# Patient Record
Sex: Female | Born: 1995
Health system: Southern US, Community
[De-identification: ages and names within clinical notes are randomized; demographics above are authoritative.]

## PROBLEM LIST (undated history)

## (undated) ENCOUNTER — Inpatient Hospital Stay (HOSPITAL_COMMUNITY): Payer: Self-pay

## (undated) DIAGNOSIS — E0590 Thyrotoxicosis, unspecified without thyrotoxic crisis or storm: Secondary | ICD-10-CM

## (undated) DIAGNOSIS — I10 Essential (primary) hypertension: Secondary | ICD-10-CM

## (undated) DIAGNOSIS — K219 Gastro-esophageal reflux disease without esophagitis: Secondary | ICD-10-CM

## (undated) DIAGNOSIS — G8929 Other chronic pain: Secondary | ICD-10-CM

## (undated) DIAGNOSIS — K589 Irritable bowel syndrome without diarrhea: Secondary | ICD-10-CM

## (undated) DIAGNOSIS — R109 Unspecified abdominal pain: Secondary | ICD-10-CM

## (undated) HISTORY — DX: Unspecified abdominal pain: R10.9

## (undated) HISTORY — DX: Other chronic pain: G89.29

## (undated) HISTORY — PX: TOOTH EXTRACTION: SUR596

---
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
## 2015-01-26 ENCOUNTER — Ambulatory Visit: Payer: Self-pay | Admitting: Podiatry

## 2015-01-31 ENCOUNTER — Ambulatory Visit: Payer: Self-pay | Admitting: Podiatry

## 2015-02-15 ENCOUNTER — Other Ambulatory Visit: Payer: Self-pay | Admitting: Nurse Practitioner

## 2015-02-15 DIAGNOSIS — K5909 Other constipation: Secondary | ICD-10-CM

## 2015-02-15 DIAGNOSIS — R109 Unspecified abdominal pain: Principal | ICD-10-CM

## 2015-02-15 DIAGNOSIS — R194 Change in bowel habit: Secondary | ICD-10-CM

## 2015-02-15 DIAGNOSIS — R1013 Epigastric pain: Secondary | ICD-10-CM

## 2015-02-15 DIAGNOSIS — G8929 Other chronic pain: Secondary | ICD-10-CM

## 2015-02-22 ENCOUNTER — Ambulatory Visit
Admission: RE | Admit: 2015-02-22 | Discharge: 2015-02-22 | Disposition: A | Discharge status: Home / Self Care | Payer: BLUE CROSS/BLUE SHIELD | Source: Ambulatory Visit | Attending: Nurse Practitioner | Admitting: Nurse Practitioner

## 2015-02-22 DIAGNOSIS — G8929 Other chronic pain: Secondary | ICD-10-CM | POA: Diagnosis present

## 2015-02-22 DIAGNOSIS — R194 Change in bowel habit: Secondary | ICD-10-CM

## 2015-02-22 DIAGNOSIS — R195 Other fecal abnormalities: Secondary | ICD-10-CM | POA: Insufficient documentation

## 2015-02-22 DIAGNOSIS — K5909 Other constipation: Secondary | ICD-10-CM

## 2015-02-22 DIAGNOSIS — R109 Unspecified abdominal pain: Secondary | ICD-10-CM | POA: Insufficient documentation

## 2015-02-22 DIAGNOSIS — K5900 Constipation, unspecified: Secondary | ICD-10-CM | POA: Diagnosis not present

## 2015-02-22 DIAGNOSIS — R1013 Epigastric pain: Secondary | ICD-10-CM

## 2015-03-01 ENCOUNTER — Other Ambulatory Visit: Payer: Self-pay | Admitting: Nurse Practitioner

## 2015-03-01 DIAGNOSIS — G8929 Other chronic pain: Secondary | ICD-10-CM

## 2015-03-01 DIAGNOSIS — K5909 Other constipation: Secondary | ICD-10-CM

## 2015-03-01 DIAGNOSIS — R109 Unspecified abdominal pain: Secondary | ICD-10-CM

## 2015-03-01 DIAGNOSIS — R1013 Epigastric pain: Secondary | ICD-10-CM

## 2015-03-08 ENCOUNTER — Ambulatory Visit: Payer: BLUE CROSS/BLUE SHIELD

## 2015-03-16 ENCOUNTER — Ambulatory Visit
Admission: RE | Admit: 2015-03-16 | Discharge: 2015-03-16 | Disposition: A | Discharge status: Home / Self Care | Payer: BLUE CROSS/BLUE SHIELD | Source: Ambulatory Visit | Attending: Nurse Practitioner | Admitting: Nurse Practitioner

## 2015-03-16 DIAGNOSIS — K5900 Constipation, unspecified: Secondary | ICD-10-CM | POA: Insufficient documentation

## 2015-03-16 DIAGNOSIS — R109 Unspecified abdominal pain: Secondary | ICD-10-CM

## 2015-03-16 DIAGNOSIS — K5909 Other constipation: Secondary | ICD-10-CM

## 2015-03-16 DIAGNOSIS — G8929 Other chronic pain: Secondary | ICD-10-CM

## 2015-03-16 DIAGNOSIS — R1013 Epigastric pain: Secondary | ICD-10-CM

## 2015-03-16 MED ORDER — IOHEXOL 300 MG/ML  SOLN
85.0000 mL | Freq: Once | INTRAMUSCULAR | Status: AC | PRN
  Administered 2015-03-16: 100 mL via INTRAVENOUS

## 2015-06-15 ENCOUNTER — Emergency Department (HOSPITAL_COMMUNITY): Payer: BLUE CROSS/BLUE SHIELD

## 2015-06-15 ENCOUNTER — Encounter (HOSPITAL_COMMUNITY): Payer: Self-pay | Admitting: Family Medicine

## 2015-06-15 ENCOUNTER — Emergency Department (HOSPITAL_COMMUNITY)
Admission: EM | Admit: 2015-06-15 | Discharge: 2015-06-15 | Disposition: A | Discharge status: Home / Self Care | Payer: BLUE CROSS/BLUE SHIELD | Attending: Emergency Medicine | Admitting: Emergency Medicine

## 2015-06-15 DIAGNOSIS — R109 Unspecified abdominal pain: Secondary | ICD-10-CM

## 2015-06-15 DIAGNOSIS — R1013 Epigastric pain: Secondary | ICD-10-CM | POA: Diagnosis not present

## 2015-06-15 DIAGNOSIS — Z3202 Encounter for pregnancy test, result negative: Secondary | ICD-10-CM | POA: Insufficient documentation

## 2015-06-15 DIAGNOSIS — R112 Nausea with vomiting, unspecified: Secondary | ICD-10-CM | POA: Insufficient documentation

## 2015-06-15 DIAGNOSIS — Z792 Long term (current) use of antibiotics: Secondary | ICD-10-CM | POA: Diagnosis not present

## 2015-06-15 LAB — CBC
HEMATOCRIT: 42.6 % (ref 36.0–46.0)
HEMOGLOBIN: 14.8 g/dL (ref 12.0–15.0)
MCH: 31.2 pg (ref 26.0–34.0)
MCHC: 34.7 g/dL (ref 30.0–36.0)
MCV: 89.9 fL (ref 78.0–100.0)
Platelets: 318 10*3/uL (ref 150–400)
RBC: 4.74 MIL/uL (ref 3.87–5.11)
RDW: 12 % (ref 11.5–15.5)
WBC: 11 10*3/uL — AB (ref 4.0–10.5)

## 2015-06-15 LAB — COMPREHENSIVE METABOLIC PANEL
ALT: 14 U/L (ref 14–54)
AST: 21 U/L (ref 15–41)
Albumin: 3.9 g/dL (ref 3.5–5.0)
Alkaline Phosphatase: 67 U/L (ref 38–126)
Anion gap: 11 (ref 5–15)
BUN: 9 mg/dL (ref 6–20)
CHLORIDE: 104 mmol/L (ref 101–111)
CO2: 24 mmol/L (ref 22–32)
Calcium: 9.9 mg/dL (ref 8.9–10.3)
Creatinine, Ser: 0.8 mg/dL (ref 0.44–1.00)
Glucose, Bld: 99 mg/dL (ref 65–99)
POTASSIUM: 4 mmol/L (ref 3.5–5.1)
SODIUM: 139 mmol/L (ref 135–145)
Total Bilirubin: 0.4 mg/dL (ref 0.3–1.2)
Total Protein: 7.6 g/dL (ref 6.5–8.1)

## 2015-06-15 LAB — URINALYSIS, ROUTINE W REFLEX MICROSCOPIC
Bilirubin Urine: NEGATIVE
Glucose, UA: NEGATIVE mg/dL
Hgb urine dipstick: NEGATIVE
KETONES UR: NEGATIVE mg/dL
LEUKOCYTES UA: NEGATIVE
NITRITE: NEGATIVE
PH: 6 (ref 5.0–8.0)
Protein, ur: NEGATIVE mg/dL
SPECIFIC GRAVITY, URINE: 1.02 (ref 1.005–1.030)
Urobilinogen, UA: 0.2 mg/dL (ref 0.0–1.0)

## 2015-06-15 LAB — LIPASE, BLOOD: LIPASE: 27 U/L (ref 22–51)

## 2015-06-15 LAB — HCG, QUANTITATIVE, PREGNANCY: hCG, Beta Chain, Quant, S: <1 m[IU]/mL (ref <5)

## 2015-06-15 MED ORDER — SODIUM CHLORIDE 0.9 % IV BOLUS (SEPSIS)
1000.0000 mL | Freq: Once | INTRAVENOUS | Status: AC
Start: 2015-06-15 — End: 2015-06-15
  Administered 2015-06-15: 1000 mL via INTRAVENOUS

## 2015-06-15 MED ORDER — ACETAMINOPHEN 500 MG PO TABS: 500.0000 mg | ORAL_TABLET | Freq: Four times a day (QID) | ORAL | Status: DC | PRN

## 2015-06-15 MED ORDER — ACETAMINOPHEN 325 MG PO TABS
650.0000 mg | ORAL_TABLET | Freq: Once | ORAL | Status: AC
Start: 2015-06-15 — End: 2015-06-15
  Administered 2015-06-15: 650 mg via ORAL
  Filled 2015-06-15: qty 2

## 2015-06-15 MED ORDER — ONDANSETRON HCL 4 MG PO TABS
4.0000 mg | ORAL_TABLET | Freq: Once | ORAL | Status: DC
  Filled 2015-06-15: qty 1

## 2015-06-15 NOTE — Discharge Instructions (Signed)

## 2015-06-15 NOTE — ED Notes (Signed)
Discharge instructions/prescription reviewed with patient/parents. Understanding verbalized. Patient declined wheelchair at time of discharge. No acute distress noted at time of discharge.

## 2015-06-15 NOTE — ED Notes (Signed)
Pt here for abd cramping, N,V,D that started this am after eating chik fi la. sts laying down makes it better. sts mid abdomen.

## 2015-06-15 NOTE — ED Provider Notes (Signed)
CSN: [id number]     Arrival date & time 06/15/15  1224 History   First MD Initiated Contact with Patient 06/15/15 1531     Chief Complaint  Patient presents with  . Abdominal Pain  . Emesis     (Consider location/radiation/quality/duration/timing/severity/associated sxs/prior Treatment) HPI  Lynn Henry is a 19 y.o. female with no significant past medical history who presents with sudden onset of constant nonradiating epigastric abdominal pain that began after breakfast this morning. Nothing makes it better or worse. Associated symptoms include diarrhea, tenesmus, vomiting, back pain, and headache. Denies chest pain, shortness of breath, neck stiffness, fevers, or chills. No history of abdominal surgeries. No sick contacts. No recent travel. No recent changes in medications.   History reviewed. No pertinent past medical history. History reviewed. No pertinent past surgical history. History reviewed. No pertinent family history. Social History  Substance Use Topics  . Smoking status: Never Smoker   . Smokeless tobacco: Never Used  . Alcohol Use: None   OB History    No data available     Review of Systems All other systems negative unless otherwise stated in HPI    Allergies  Review of patient's allergies indicates no known allergies.  Home Medications   Prior to Admission medications   Medication Sig Start Date End Date Taking? Authorizing Mayley Lish  cephALEXin (KEFLEX) 500 MG capsule Take 1 capsule (500 mg total) by mouth 3 (three) times daily. 12/23/14   Vivi Barrack, DPM  desogestrel-ethinyl estradiol (VIORELE) 0.15-0.02/0.01 MG (21/5) tablet Take 1 tablet by mouth at bedtime.    Historical Maeby Vankleeck, MD  levocetirizine (XYZAL) 5 MG tablet Take 5 mg by mouth at bedtime.    Historical Jusitn Salsgiver, MD  neomycin-polymyxin-hydrocortisone (CORTISPORIN) otic solution 1-2 drops to the toe after soaking twice daily 12/15/14   Max T Hyatt, DPM   BP 126/81 mmHg  Pulse 100   Temp(Src) 98.8 F (37.1 C) (Oral)  Resp 16  Ht  (1.651 m)  Wt 120 lb (54.432 kg)  BMI 19.97 kg/m2  SpO2 100%  LMP 06/01/2015 Physical Exam  Constitutional: She is oriented to person, place, and time. She appears well-developed and well-nourished.  HENT:  Head: Normocephalic and atraumatic.  Mouth/Throat: Oropharynx is clear and moist.  Eyes: Pupils are equal, round, and reactive to light.  Neck: Normal range of motion. Neck supple.  Cardiovascular: Normal rate and regular rhythm.   No murmur heard. Pulmonary/Chest: Effort normal and breath sounds normal. No respiratory distress. She has no wheezes. She has no rales.  Abdominal: Soft. Bowel sounds are normal. She exhibits no distension. There is tenderness in the epigastric area. There is no rigidity, no rebound and no guarding.  Musculoskeletal: Normal range of motion.  Lymphadenopathy:    She has no cervical adenopathy.  Neurological: She is alert and oriented to person, place, and time.  Skin: Skin is warm and dry.  Psychiatric: She has a normal mood and affect. Her behavior is normal.    ED Course  Procedures (including critical care time) Labs Review Labs Reviewed  CBC - Abnormal; Notable for the following:    WBC 11.0 (*)    All other components within normal limits  LIPASE, BLOOD  COMPREHENSIVE METABOLIC PANEL  URINALYSIS, ROUTINE W REFLEX MICROSCOPIC (NOT AT [facility])  HCG, QUANTITATIVE, PREGNANCY    Imaging Review Dg Abd 1 View  06/15/2015   CLINICAL DATA:  Periumbilical pain/ cramping with vomiting and diarrhea today. No fever. Initial encounter.  EXAM: ABDOMEN -  1 VIEW  COMPARISON:  CT 03/16/2015.  FINDINGS: The bowel gas pattern is normal. No radio-opaque calculi or other significant radiographic abnormality are seen.  IMPRESSION: Unremarkable supine abdominal radiograph.   Electronically Signed   By: Carey Bullocks M.D.   On: 06/15/2015 18:38   I have personally reviewed and evaluated these images and lab  results as part of my medical decision-making.   EKG Interpretation None      MDM   Final diagnoses:  None    Patient presents with sudden onset of constant, non-radiating epigastric abdominal pain with associated vomiting, tenesmus, back pain, and HA. VSS, NAD, patient appears nontoxic. On exam, she has mild tenderness to palpation epigastric region. Labs performed include UA, lipase, CMP, CBC and hCG. Will give All labs normal. Abdominal x-ray shows normal gas pattern, and no other acute abdominal etiology. Upon reassessment, the patient's pain is improved and she is feeling better. Suspect viral etiology. Patient advised to follow-up with her PCP. Discussed return precautions. Patient agrees and acknowledges this plan.  Case has been discussed with and seen by Dr. Deretha Emory who agrees with the above plan for discharge.     Cheri Fowler, PA-C 06/15/15 1907  Vanetta Mulders, MD 06/15/15 [phone]

## 2015-10-26 ENCOUNTER — Encounter: Payer: Self-pay | Admitting: Family Medicine

## 2015-10-26 ENCOUNTER — Ambulatory Visit (INDEPENDENT_AMBULATORY_CARE_PROVIDER_SITE_OTHER): Payer: 59 | Admitting: Family Medicine

## 2015-10-26 VITALS — BP 104/68 | HR 107 | Temp 98.2°F | Ht 64.0 in | Wt 129.8 lb

## 2015-10-26 DIAGNOSIS — Z0000 Encounter for general adult medical examination without abnormal findings: Secondary | ICD-10-CM

## 2015-10-26 DIAGNOSIS — R1013 Epigastric pain: Secondary | ICD-10-CM

## 2015-10-26 DIAGNOSIS — G8929 Other chronic pain: Secondary | ICD-10-CM | POA: Diagnosis not present

## 2015-10-26 DIAGNOSIS — J302 Other seasonal allergic rhinitis: Secondary | ICD-10-CM | POA: Insufficient documentation

## 2015-10-26 NOTE — Assessment & Plan Note (Signed)
Tdap up to date. Declines influenza. Has had a Pap smear. No labs today as she's had recent laboratory studies.

## 2015-10-26 NOTE — Progress Notes (Signed)
Subjective:  Patient ID: Lynn Henry, female    DOB: 1995/12/05  Age: 20 y.o. MRN: [id number]  CC: Establish care, Abdominal pain/diarrhea  HPI:  20 year old female presents to establish care.  She has additional concerns as below.  Preventative Healthcare  Pap smear: Has had a pap smear by Gyn due to sexually active.  Immunizations  Tetanus - Up to date (last done in 2008).  Flu - Declined.  Exercise: Regular exercise.   Alcohol use: No.   Smoking/tobacco use: Former smoker.  Chronic abdominal pain, Diarrhea  Reports that this has been a problem for years (since she was a child).  She has had severe pain in the past year and has been evaluated in the ED as well as by GI. I have reviewed these encounters.  Patient saw GI in May of 2016. She had [place], CT scan which were normal.  Notes revealed that she had a positive FOBT. She was also found to have B12 deficiency. Colonoscopy was recommended but does not appear that it was performed. ED encounter in Sept of 2016 was for severe epigastric pain. Labs and imaging were unremarkable and did not reveal a clear etiology.   She has been treated with laxatives and PPI's in the past with no improvement.  She has altered her diet with some improvement.  She continues to have frequent diarrhea and abdominal pain (predominantly epigastric).   She avoids foods that trigger.  She denies gas, bloating.  She states that she has a "knot" in the epigastric region.  Unclear etiology but she has been told that she has IBS. She is concerned that this is from her Gallbladder.  PMH, Surgical Hx, Family Hx, Social History reviewed and updated as below.  Past Medical History  Diagnosis Date  . Chronic abdominal pain     presumed IBS    Past Surgical History  Procedure Laterality Date  . No past surgeries      Family History  Problem Relation Age of Onset  . Hyperlipidemia Mother   . Hypertension Mother   . Prostate cancer  Maternal Grandfather   . Hyperlipidemia Maternal Grandfather   . Heart disease Maternal Grandfather   . Stroke Maternal Grandfather   . Hypertension Maternal Grandfather   . Arthritis Paternal Grandmother   . Prostate cancer Paternal Grandfather   . Diabetes Paternal Grandfather     Social History  Substance Use Topics  . Smoking status: Former Games developer  . Smokeless tobacco: Never Used  . Alcohol Use: No   Review of Systems  Gastrointestinal: Positive for abdominal pain, diarrhea and constipation.  All other systems reviewed and are negative.  Objective:  BP 104/68 mmHg  Pulse 107  Temp(Src) 98.2 F (36.8 C) (Oral)  Ht  (1.626 m)  Wt 129 lb 12 oz (58.854 kg)  BMI 22.26 kg/m2  SpO2 99%  LMP 10/17/2015 (Exact Date)  BP/Weight 10/26/2015 06/15/2015 03/16/2015  Systolic BP 104 108 -  Diastolic BP 68 71 -  Wt. (Lbs) 129.75 120 120  BMI 22.26 19.97 19.97   Physical Exam  Constitutional: She is oriented to person, place, and time. She appears well-developed. No distress.  HENT:  Head: Normocephalic and atraumatic.  Eyes: Conjunctivae are normal. No scleral icterus.  Neck: Normal range of motion.  Cardiovascular: Normal rate and regular rhythm.   No murmur heard. Pulmonary/Chest: Effort normal and breath sounds normal. She has no wheezes. She has no rales.  Abdominal:  Soft, nondistended. Tenderness  to palpation in the epigastric region. No rebound and no guarding.  Musculoskeletal: Normal range of motion. She exhibits no edema.  Neurological: She is alert and oriented to person, place, and time.  Skin: Skin is warm and dry. No rash noted.  Psychiatric: She has a normal mood and affect.  Vitals reviewed.  Lab Results  Component Value Date   WBC 11.0* 06/15/2015   HGB 14.8 06/15/2015   HCT 42.6 06/15/2015   PLT 318 06/15/2015   GLUCOSE 99 06/15/2015   ALT 14 06/15/2015   AST 21 06/15/2015   NA 139 06/15/2015   K 4.0 06/15/2015   CL 104 06/15/2015   CREATININE  0.80 06/15/2015   BUN 9 06/15/2015   CO2 24 06/15/2015    Assessment & Plan:   Problem List Items Addressed This Visit    Abdominal pain, chronic, epigastric    Patient has had a thorough workup including ultrasound, CT, and laboratory studies. Labs did reveal vitamin B12 deficiency. CT and ultrasound have been unremarkable. Patient has seen GI. Colonoscopy has not been done. Patient feels that this could be related to gallbladder disease and would like a HIDA scan. I informed her that the likelihood is small given the duration of her her problem but biliary dyskinesia cannot be ruled out. Will obtain HIDA scan. Discussed treatment with medications for IBS and patient declined. She states that she wants to wait until HIDA scan is obtained.       Relevant Orders   NM Hepato W/Eject Fract   Preventative health care - Primary    Tdap up to date. Declines influenza. Has had a Pap smear. No labs today as she's had recent laboratory studies.         Follow-up: We be scheduled after imaging results.  Everlene Other DO [facility]

## 2015-10-26 NOTE — Assessment & Plan Note (Addendum)
Patient has had a thorough workup including ultrasound, CT, and laboratory studies. Labs did reveal vitamin B12 deficiency. CT and ultrasound have been unremarkable. Patient has seen GI. Colonoscopy has not been done. Patient feels that this could be related to gallbladder disease and would like a HIDA scan. I informed her that the likelihood is small given the duration of her her problem but biliary dyskinesia cannot be ruled out. Will obtain HIDA scan. Discussed treatment with medications for IBS and patient declined. She states that she wants to wait until HIDA scan is obtained.

## 2015-10-26 NOTE — Patient Instructions (Signed)
We will call with your appt.  Follow up after your scan.  Take care  Dr. Adriana Simas

## 2015-11-10 ENCOUNTER — Ambulatory Visit
Admission: RE | Admit: 2015-11-10 | Discharge: 2015-11-10 | Disposition: A | Discharge status: Home / Self Care | Payer: 59 | Source: Ambulatory Visit | Attending: Family Medicine | Admitting: Family Medicine

## 2015-11-10 DIAGNOSIS — R1013 Epigastric pain: Secondary | ICD-10-CM | POA: Insufficient documentation

## 2015-11-10 DIAGNOSIS — G8929 Other chronic pain: Secondary | ICD-10-CM

## 2015-11-10 MED ORDER — SINCALIDE 5 MCG IJ SOLR
0.0200 ug/kg | Freq: Once | INTRAMUSCULAR | Status: AC
  Administered 2015-11-10: 1.17 ug via INTRAVENOUS

## 2015-11-10 MED ORDER — TECHNETIUM TC 99M MEBROFENIN IV KIT
5.0000 | PACK | Freq: Once | INTRAVENOUS | Status: AC | PRN
  Administered 2015-11-10: 5.04 via INTRAVENOUS

## 2016-04-02 ENCOUNTER — Ambulatory Visit (INDEPENDENT_AMBULATORY_CARE_PROVIDER_SITE_OTHER): Payer: 59 | Admitting: Family Medicine

## 2016-04-02 ENCOUNTER — Encounter: Payer: Self-pay | Admitting: Family Medicine

## 2016-04-02 ENCOUNTER — Emergency Department
Admission: EM | Admit: 2016-04-02 | Discharge: 2016-04-03 | Disposition: A | Discharge status: Home / Self Care | Payer: 59 | Attending: Emergency Medicine | Admitting: Emergency Medicine

## 2016-04-02 ENCOUNTER — Encounter: Payer: Self-pay | Admitting: Emergency Medicine

## 2016-04-02 VITALS — BP 106/72 | HR 101 | Temp 98.4°F | Ht 64.0 in | Wt 131.6 lb

## 2016-04-02 DIAGNOSIS — W57XXXA Bitten or stung by nonvenomous insect and other nonvenomous arthropods, initial encounter: Secondary | ICD-10-CM | POA: Diagnosis not present

## 2016-04-02 DIAGNOSIS — F17210 Nicotine dependence, cigarettes, uncomplicated: Secondary | ICD-10-CM | POA: Diagnosis not present

## 2016-04-02 DIAGNOSIS — L03114 Cellulitis of left upper limb: Secondary | ICD-10-CM | POA: Diagnosis not present

## 2016-04-02 DIAGNOSIS — Y929 Unspecified place or not applicable: Secondary | ICD-10-CM | POA: Diagnosis not present

## 2016-04-02 DIAGNOSIS — Y999 Unspecified external cause status: Secondary | ICD-10-CM | POA: Insufficient documentation

## 2016-04-02 DIAGNOSIS — Z79899 Other long term (current) drug therapy: Secondary | ICD-10-CM | POA: Insufficient documentation

## 2016-04-02 DIAGNOSIS — Y939 Activity, unspecified: Secondary | ICD-10-CM | POA: Insufficient documentation

## 2016-04-02 DIAGNOSIS — L0390 Cellulitis, unspecified: Secondary | ICD-10-CM | POA: Insufficient documentation

## 2016-04-02 DIAGNOSIS — S50862A Insect bite (nonvenomous) of left forearm, initial encounter: Secondary | ICD-10-CM | POA: Diagnosis present

## 2016-04-02 HISTORY — DX: Irritable bowel syndrome, unspecified: K58.9

## 2016-04-02 MED ORDER — DOXYCYCLINE HYCLATE 100 MG PO TABS: 100.0000 mg | ORAL_TABLET | Freq: Two times a day (BID) | ORAL | Status: DC

## 2016-04-02 MED ORDER — CLINDAMYCIN HCL 300 MG PO CAPS: 300.0000 mg | ORAL_CAPSULE | Freq: Three times a day (TID) | ORAL | Status: DC

## 2016-04-02 MED ORDER — DEXTROSE 5 % IV SOLN
1.0000 g | Freq: Once | INTRAVENOUS | Status: AC
  Administered 2016-04-03: 1 g via INTRAVENOUS
  Filled 2016-04-02: qty 10

## 2016-04-02 MED ORDER — HYDROCODONE-ACETAMINOPHEN 5-325 MG PO TABS
1.0000 | ORAL_TABLET | Freq: Once | ORAL | Status: AC
  Administered 2016-04-02: 1 via ORAL
  Filled 2016-04-02: qty 1

## 2016-04-02 NOTE — Discharge Instructions (Signed)

## 2016-04-02 NOTE — Assessment & Plan Note (Signed)
Rash consistent with cellulitis. Possibly from spider bite or other bug bite. Not responsive to Augmentin. We will switch her to doxycycline. She is currently on her menstrual cycle. Counseled her against becoming pregnant while on doxycycline. She'll continue to monitor. Advised against using topical treatments on it. Benadryl and allergy medication that she is already on for itching. She'll follow-up in 2 days. She is given return precautions.

## 2016-04-02 NOTE — ED Notes (Addendum)
Pt presents to ED with a possible insect bit to her left forearm. Pt reports using MD on call Sunday and was prescribed antibiotics. Wound did not improved and pt was seen by her pcp today and was prescribed a different antibiotic. Redness has continued to spread down arm. No drainage noted. 3 doses of antibiotic taken by pt. Denies fever. Redness within edges drawn in with pen from previous visit.

## 2016-04-02 NOTE — Progress Notes (Signed)
Pre visit review using our clinic review tool, if applicable. No additional management support is needed unless otherwise documented below in the visit note. 

## 2016-04-02 NOTE — Patient Instructions (Signed)
Nice to meet you. You appear to have cellulitis which is a bacterial skin infection. We will change you from Augmentin to doxycycline. You will take this 1 tablet twice daily for 1 week. We will see you back in 2 days for recheck.  If you develop fevers, chills, nausea, vomiting, spreading redness, numbness, tingling, or any new or change in symptoms please seek medical attention.

## 2016-04-02 NOTE — ED Provider Notes (Signed)
[facility] Emergency Department Provider Note  ____________________________________________  Time seen: Approximately 10:47 PM  I have reviewed the triage vital signs and the nursing notes.   HISTORY  Chief Complaint Wound Check    HPI Lynn Henry is a 20 y.o. female presents for sudden worsening in onset of insect bite to her left forearm. Patient states that she was bit 2 days ago called the M.D. on-call was prescribed amoxicillin and triamcinolone cream. Wound did not improve his seen by her PCP this morning prescribed doxycycline and took one dose and the redness is continued to extend both superiorly and distally down her arm. Denies drainage. Unsure what bit her.   Past Medical History  Diagnosis Date  . Chronic abdominal pain     presumed IBS  . IBS (irritable bowel syndrome)     Patient Active Problem List   Diagnosis Date Noted  . Cellulitis 04/02/2016  . Preventative health care 10/26/2015  . Seasonal allergies 10/26/2015  . Abdominal pain, chronic, epigastric 10/26/2015    Past Surgical History  Procedure Laterality Date  . No past surgeries      Current Outpatient Rx  Name  Route  Sig  Dispense  Refill  . desloratadine (CLARINEX) 5 MG tablet   Oral   Take 5 mg by mouth daily.         Marland Kitchen. desogestrel-ethinyl estradiol (VIORELE) 0.15-0.02/0.01 MG (21/5) tablet   Oral   Take 1 tablet by mouth at bedtime. Reported on 10/26/2015         . doxycycline (VIBRA-TABS) 100 MG tablet   Oral   Take 1 tablet by mouth 2 (two) times daily.      0   . montelukast (SINGULAIR) 10 MG tablet   Oral   Take 10 mg by mouth at bedtime.         . clindamycin (CLEOCIN) 300 MG capsule   Oral   Take 1 capsule (300 mg total) by mouth 3 (three) times daily.   30 capsule   0     Allergies Review of patient's allergies indicates no known allergies.  Family History  Problem Relation Age of Onset  . Hyperlipidemia Mother   .  Hypertension Mother   . Prostate cancer Maternal Grandfather   . Hyperlipidemia Maternal Grandfather   . Heart disease Maternal Grandfather   . Stroke Maternal Grandfather   . Hypertension Maternal Grandfather   . Arthritis Paternal Grandmother   . Prostate cancer Paternal Grandfather   . Diabetes Paternal Grandfather     Social History Social History  Substance Use Topics  . Smoking status: Current Every Day Smoker -- 0.50 packs/day    Types: Cigarettes  . Smokeless tobacco: Never Used  . Alcohol Use: No    Review of Systems Constitutional: No fever/chills Eyes: No visual changes. ENT: No sore throat. Cardiovascular: Denies chest pain. Respiratory: Denies shortness of breath. Gastrointestinal: No abdominal pain.  No nausea, no vomiting.  No diarrhea.  No constipation. Genitourinary: Negative for dysuria. Musculoskeletal: Negative for back pain. Skin: Positive for erythema redness left anterior forearm Neurological: Negative for headaches, focal weakness or numbness.  10-point ROS otherwise negative.  ____________________________________________   PHYSICAL EXAM:  VITAL SIGNS: ED Triage Vitals  Enc Vitals Group     BP 04/02/16 2155 134/85 mmHg     Pulse Rate 04/02/16 2155 131     Resp 04/02/16 2155 18     Temp 04/02/16 2155 98.4 F (36.9 C)  Temp Source 04/02/16 2155 Oral     SpO2 04/02/16 2155 98 %     Weight 04/02/16 2155 130 lb (58.968 kg)     Height 04/02/16 2155  (1.651 m)     Head Cir --      Peak Flow --      Pain Score 04/02/16 2154 5     Pain Loc --      Pain Edu? --      Excl. in GC? --     Constitutional: Alert and oriented. Well appearing and in no acute distress. Eyes: Conjunctivae are normal. PERRL. EOMI. Head: Atraumatic. Nose: No congestion/rhinnorhea. Mouth/Throat: Mucous membranes are moist.  Oropharynx non-erythematous. Neck: No stridor.   Cardiovascular: Normal rate, regular rhythm. Grossly normal heart sounds.  Good  peripheral circulation. Respiratory: Normal respiratory effort.  No retractions. Lungs CTAB. Gastrointestinal: Soft and nontender. No distention. No abdominal bruits. No CVA tenderness. Musculoskeletal: No lower extremity tenderness nor edema.  No joint effusions. Neurologic:  Normal speech and language. No gross focal neurologic deficits are appreciated. No gait instability. Skin:  Skin is warm, dry and intact. Positive area of erythema and redness noted measuring 18 cm x 9.5 cm in area punctate was noted within the center with vesicular lesion times one. Psychiatric: Mood and affect are normal. Speech and behavior are normal.  ____________________________________________   LABS (all labs ordered are listed, but only abnormal results are displayed)  Labs Reviewed - No data to display ____________________________________________  EKG   ____________________________________________  RADIOLOGY   ____________________________________________   PROCEDURES  Procedure(s) performed: None  Critical Care performed: No  ____________________________________________   INITIAL IMPRESSION / ASSESSMENT AND PLAN / ED COURSE  Pertinent labs & imaging results that were available during my care of the patient were reviewed by me and considered in my medical decision making (see chart for details).  Insect bite with worsening cellulitis. Patient was given Rocephin 1 g IV while in the ED. Discharged home with clindamycin 300 mg. Patient follow-up with her PCP or return to the ED with any worsening symptomology. ____________________________________________   FINAL CLINICAL IMPRESSION(S) / ED DIAGNOSES  Final diagnoses:  Cellulitis of left upper extremity  Insect bite     This chart was dictated using voice recognition software/Dragon. Despite best efforts to proofread, errors can occur which can change the meaning. Any change was purely unintentional.   Evangeline Dakin, PA-C 04/02/16  2312  Myrna Blazer, MD 04/02/16 [phone]

## 2016-04-02 NOTE — Progress Notes (Signed)
Patient ID: Lynn Henry, female   DOB: [birth year], 20 y.o.   MRN: [id number]  Marikay AlarEric Hilary Milks, MD Phone: [phone]  Lynn Henry is a 20 y.o. female who presents today for same-day visit.  Patient notes Saturday evening she got bit by something on her left forearm. There was mild redness and a dark circle in the area of the bite. The redness progressively spread where the next morning it was slightly larger. At that time she called her insurance company and spoke with the doctor on call for the insurance company who prescribed her Augmentin and a topical steroid. She notes taking 2 doses of Augmentin and the redness has continued to spread up her forearm. There is some itching. There is pain in the area. There is warmth to it. She is not having any fevers or chills. No numbness or tingling. No nausea or vomiting.  PMH: Former smoker ROS see history of present illness  Objective  Physical Exam Filed Vitals:   04/02/16 1306  BP: 106/72  Pulse: 101  Temp: 98.4 F (36.9 C)    BP Readings from Last 3 Encounters:  04/02/16 106/72  10/26/15 104/68  06/15/15 108/71   Wt Readings from Last 3 Encounters:  04/02/16 131 lb 9.6 oz (59.693 kg) (56 %*, Z = 0.15)  11/10/15 129 lb (58.514 kg) (53 %*, Z = 0.06)  10/26/15 129 lb 12 oz (58.854 kg) (54 %*, Z = 0.10)   * Growth percentiles are based on CDC 2-20 Years data.    Physical Exam  Constitutional: She is well-developed, well-nourished, and in no distress.  HENT:  Head: Normocephalic and atraumatic.  Right Ear: External ear normal.  Left Ear: External ear normal.  Cardiovascular: Normal heart sounds.  Exam reveals no gallop and no friction rub.   No murmur heard. Tachycardic, 2+ radial pulses bilaterally, capillary refill less than 2 seconds bilaterally in hands  Pulmonary/Chest: Effort normal and breath sounds normal.  Neurological: She is alert. Gait normal.  Skin: She is not diaphoretic.        Assessment/Plan:  Please see individual problem list.  Cellulitis Rash consistent with cellulitis. Possibly from spider bite or other bug bite. Not responsive to Augmentin. We will switch her to doxycycline. She is currently on her menstrual cycle. Counseled her against becoming pregnant while on doxycycline. She'll continue to monitor. Advised against using topical treatments on it. Benadryl and allergy medication that she is already on for itching. She'll follow-up in 2 days. She is given return precautions.    No orders of the defined types were placed in this encounter.    Meds ordered this encounter  Medications  . doxycycline (VIBRA-TABS) 100 MG tablet    Sig: Take 1 tablet (100 mg total) by mouth 2 (two) times daily.    Dispense:  14 tablet    Refill:  0    Marikay AlarEric Jervon Ream, MD [facility] [place]

## 2016-04-02 NOTE — ED Notes (Signed)
Pt presents to ED with a possible insect bit to her left forearm on Saturday. Pt reports using MD on call Sunday and was prescribed antibiotics Amoxicillin and itch cream. Wound did not improved and pt was seen by her pcp today and was prescribed a different antibioticDoxcycline time one. Redness has continued to spread down arm. No drainage noted.   Denies fever. Redness within edges drawn in with pen from previous visit. Pain and itching 5/10.

## 2016-04-02 NOTE — ED Notes (Signed)
Wound redness measurements 18cmx9.5cm

## 2016-04-03 DIAGNOSIS — L03114 Cellulitis of left upper limb: Secondary | ICD-10-CM | POA: Diagnosis not present

## 2016-04-03 NOTE — ED Notes (Signed)
Pts redness decreased in size 12cmX8.5cm. Itching and burning improved.

## 2016-04-04 ENCOUNTER — Encounter: Payer: Self-pay | Admitting: Family Medicine

## 2016-04-04 ENCOUNTER — Ambulatory Visit (INDEPENDENT_AMBULATORY_CARE_PROVIDER_SITE_OTHER): Payer: 59 | Admitting: Family Medicine

## 2016-04-04 VITALS — BP 104/66 | HR 102 | Temp 98.4°F | Ht 64.0 in | Wt 130.8 lb

## 2016-04-04 DIAGNOSIS — L03114 Cellulitis of left upper limb: Secondary | ICD-10-CM

## 2016-04-04 NOTE — Patient Instructions (Signed)
Nice to see you. Please finish the clindamycin as prescribed by the ED. Please wash the area with soap and water and do not use anything else to keep the area clean. If you develop spreading redness, increased pain, worsening nausea, vomiting, fevers, numbness or tingling, or any new or changing symptoms please seek medical attention.

## 2016-04-04 NOTE — Progress Notes (Signed)
Pre visit review using our clinic review tool, if applicable. No additional management support is needed unless otherwise documented below in the visit note. 

## 2016-04-04 NOTE — Assessment & Plan Note (Signed)
Improving after switched to clindamycin. Discussed continuing to monitor and finishing the course of clindamycin. It does not continue to improve she will follow-up. She is given return precautions.

## 2016-04-04 NOTE — Progress Notes (Signed)
Patient ID: Lynn Henry, female   DOB: Jun 23, 1996, 20 y.o.   MRN: [id number]  Marikay AlarEric Eashan Schipani, MD Phone: [phone]  Lynn Henry is a 20 y.o. female who presents today for follow-up.  Patient seen 2 days ago for cellulitis. Started on doxycycline. Had spreading redness into her hand and went to the emergency room. She received IV Rocephin. Then placed on clindamycin. Has taken 3 doses of clindamycin and notes that the redness has decreased. Less pain. Notes the central vesicle popped and she has been washing with soap and water. No fevers. Some nausea with the antibiotic though no vomiting or abdominal pain. She is taking the antibiotic with food.   ROS see history of present illness  Objective  Physical Exam Filed Vitals:   04/04/16 0836  BP: 104/66  Pulse: 102  Temp: 98.4 F (36.9 C)    BP Readings from Last 3 Encounters:  04/04/16 104/66  04/03/16 117/74  04/02/16 106/72   Wt Readings from Last 3 Encounters:  04/04/16 130 lb 12.8 oz (59.33 kg) (55 %*, Z = 0.11)  04/02/16 130 lb (58.968 kg) (53 %*, Z = 0.08)  04/02/16 131 lb 9.6 oz (59.693 kg) (56 %*, Z = 0.15)   * Growth percentiles are based on CDC 2-20 Years data.    Physical Exam  Constitutional: She is well-developed, well-nourished, and in no distress.  HENT:  Head: Normocephalic and atraumatic.  Cardiovascular: Normal rate, regular rhythm and normal heart sounds.   Pulmonary/Chest: Effort normal and breath sounds normal.  Musculoskeletal:  Left hand with capillary refill less than 2 seconds and sensation to light touch intact, 2+ radial pulse on the left side  Skin: She is not diaphoretic.        Assessment/Plan: Please see individual problem list.  Cellulitis Improving after switched to clindamycin. Discussed continuing to monitor and finishing the course of clindamycin. It does not continue to improve she will follow-up. She is given return precautions.     Marikay AlarEric Marianna Cid, MD [facility] [place]

## 2016-04-25 ENCOUNTER — Telehealth: Payer: Self-pay | Admitting: *Deleted

## 2016-04-25 NOTE — Telephone Encounter (Signed)
Patient stated that her antibiotic has given her diarrhea, she is aware of the side effects, however she's been off he antibiotic one week, with occasional diarrhea.  Pt contact [phone]

## 2016-04-25 NOTE — Telephone Encounter (Signed)
Will improve slowly. If it does not please let me know. PRN Imodium.

## 2016-04-25 NOTE — Telephone Encounter (Signed)
Spoke with patient and reviewed message, verbally understood. thanks

## 2016-04-25 NOTE — Telephone Encounter (Signed)
Please advise, thanks.

## 2016-09-14 IMAGING — NM NM HEPATO W/GB/PHARM/[PERSON_NAME]
2 series · 12 of 12 positions shown · non-contrast
Comparison: None.

CLINICAL DATA: Upper abdominal pain.  Nausea and vomiting.

EXAM:
NUCLEAR MEDICINE HEPATOBILIARY IMAGING WITH GALLBLADDER EF
TECHNIQUE: Sequential images of the abdomen were obtained [DATE] minutes
following intravenous administration of radiopharmaceutical. After
slow intravenous infusion of 1.17 micrograms Cholecystokinin,
gallbladder ejection fraction was determined.
RADIOPHARMACEUTICALS:  5.04 mCi 0c-MMm Choletec IV

[Series 1000: gallbladder ef. · 4.80mm/px · 6 of 120 frames shown]
[frame 11/120]
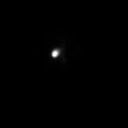
[frame 31/120]
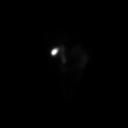
[frame 51/120]
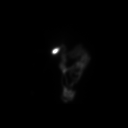
[frame 71/120]
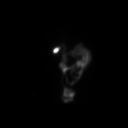
[frame 91/120]
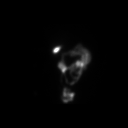
[frame 111/120]
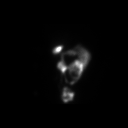

[Series 1000: hepatobiliary scan · 9.59mm/px · 6 of 60 frames shown]
[frame 6/60]
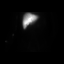
[frame 16/60]
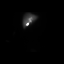
[frame 26/60]
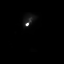
[frame 36/60]
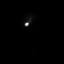
[frame 46/60]
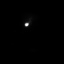
[frame 56/60]
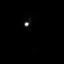

[12 of 12 positions shown; findings below may reference images not displayed]

FINDINGS: There is normal uptake and washout from the liver with normal
filling of the gallbladder. There is normal emptying of radiotracer
into the small bowel after CCK.. At 60 min, normal ejection fraction
is greater than 40%. The ejection fraction was calculated at 83%.
IMPRESSION: Normal study.

## 2016-11-07 ENCOUNTER — Telehealth: Payer: Self-pay | Admitting: Family Medicine

## 2016-11-07 NOTE — Telephone Encounter (Signed)
Reason for call: sore throat ,diagnosed with flu  Symptoms:sore throat , headache, no fever, patient states she prone to getting strep throat , appointment scheduled  Duration since  2/9  Medications: finished Tamiflu,  Last seen for this problem: 2/9  Seen by: [facility]  Appointment scheduled

## 2016-11-07 NOTE — Telephone Encounter (Signed)
Pt called and stated that [facility] and they dx'd her with the flu. She has since finished the tamiflu that they prescribed. She is still complaining of a sore throat. Please advise, thank you!  Call pt @ [phone]

## 2016-11-08 ENCOUNTER — Encounter: Payer: Self-pay | Admitting: Family Medicine

## 2016-11-08 ENCOUNTER — Ambulatory Visit (INDEPENDENT_AMBULATORY_CARE_PROVIDER_SITE_OTHER): Payer: 59 | Admitting: Family Medicine

## 2016-11-08 VITALS — BP 110/78 | HR 108 | Temp 98.7°F | Wt 138.6 lb

## 2016-11-08 DIAGNOSIS — J029 Acute pharyngitis, unspecified: Secondary | ICD-10-CM | POA: Diagnosis not present

## 2016-11-08 LAB — POCT RAPID STREP A (OFFICE): Rapid Strep A Screen: NEGATIVE

## 2016-11-08 MED ORDER — AMOXICILLIN 500 MG PO CAPS: 500.0000 mg | ORAL_CAPSULE | Freq: Two times a day (BID) | ORAL | 0 refills | Status: DC

## 2016-11-08 NOTE — Progress Notes (Addendum)
  Lynn AlarEric Briany Aye, MD Phone: [phone]  Albina BilletSavannah A Joelene MillinOliver is a 21 y.o. female who presents today for same-day visit.  Patient notes over a week of symptoms. Started with sore throat and headache and then had some chills and some diarrhea. Felt achy all over. [facility] and [facility] tested for strep throat which was negative and then treated her for influenza with Tamiflu. She notes her chills and diarrhea and achiness have improved though she continues to have sore throat and some headache. Notes minimal if any postnasal drip. No sinus or nasal congestion. No sick contacts. Has not kissed anybody other than her husband. Has not shared any drinks with anyone.  PMH: Smoker   ROS see history of present illness  Objective  Physical Exam Vitals:   11/08/16 0833 11/08/16 0848  BP: 110/78   Pulse: (!) 117 (!) 108  Temp: 98.7 F (37.1 C)     BP Readings from Last 3 Encounters:  11/08/16 110/78  04/04/16 104/66  04/03/16 117/74   Wt Readings from Last 3 Encounters:  11/08/16 138 lb 9.6 oz (62.9 kg)  04/04/16 130 lb 12.8 oz (59.3 kg) (55 %, Z= 0.11)*  04/02/16 130 lb (59 kg) (53 %, Z= 0.08)*   * Growth percentiles are based on CDC 2-20 Years data.    Physical Exam  Constitutional: No distress.  HENT:  Head: Normocephalic and atraumatic.  Moderate posterior oropharyngeal erythema with no exudate noted, no noted tonsillar enlargement, normal TMs bilaterally  Eyes: Conjunctivae are normal. Pupils are equal, round, and reactive to light.  Neck: Neck supple.  Mild tenderness of the anterior cervical lymph nodes though no enlarged lymph nodes noted  Cardiovascular: Regular rhythm and normal heart sounds.  Tachycardia present.   Pulmonary/Chest: Effort normal and breath sounds normal. No respiratory distress. She has no wheezes. She has no rales.  Neurological: She is alert. Gait normal.  Skin: Skin is warm and dry. She is not diaphoretic.     Assessment/Plan:  Please see individual problem list.  Pharyngitis Patient continues to have pharyngitis symptoms. She had negative rapid strep test today though given symptomatology and persistence of symptoms we will cover with amoxicillin and send a strep culture. She'll continue to monitor symptoms. She's given return precautions in the AVS.   Orders Placed This Encounter  Procedures  . Culture, Group A Strep    Order Specific Question:   Source    Answer:   pharynx  . POCT rapid strep A    Lynn AlarEric Markie Heffernan, MD [facility] [place]

## 2016-11-08 NOTE — Progress Notes (Signed)
Pre visit review using our clinic review tool, if applicable. No additional management support is needed unless otherwise documented below in the visit note. 

## 2016-11-08 NOTE — Patient Instructions (Signed)
Nice to see you. We will treat you with amoxicillin for pharyngitis. We will see what your strep culture reveals as well.  If you develop trouble breathing, fevers, worsening symptoms, or any new symptoms please seek medical attention immediately.

## 2016-11-08 NOTE — Assessment & Plan Note (Signed)
Patient continues to have pharyngitis symptoms. She had negative rapid strep test today though given symptomatology and persistence of symptoms we will cover with amoxicillin and send a strep culture. She'll continue to monitor symptoms. She's given return precautions in the AVS.

## 2016-11-08 NOTE — Addendum Note (Signed)
Addended by: Glori LuisSONNENBERG, Cheryll Keisler G on: 11/08/2016 09:37 AM   Modules accepted: Orders

## 2016-11-10 LAB — CULTURE, GROUP A STREP

## 2017-01-14 ENCOUNTER — Ambulatory Visit: Payer: 59 | Admitting: Family

## 2017-01-14 ENCOUNTER — Ambulatory Visit (INDEPENDENT_AMBULATORY_CARE_PROVIDER_SITE_OTHER): Payer: 59 | Admitting: Family Medicine

## 2017-01-14 ENCOUNTER — Telehealth: Payer: Self-pay | Admitting: Family Medicine

## 2017-01-14 ENCOUNTER — Encounter: Payer: Self-pay | Admitting: Family Medicine

## 2017-01-14 DIAGNOSIS — B349 Viral infection, unspecified: Secondary | ICD-10-CM | POA: Insufficient documentation

## 2017-01-14 NOTE — Patient Instructions (Signed)
Nice to see you. You likely have a viral illness that is leading to your congestion, vomiting, and diarrhea. You should try to stay well hydrated with plenty of fluids. If you develop worsening lightheadedness or you develop blood in your vomit, persistent diarrhea and vomiting, abdominal pain, worsening back pain, or any new or changing symptoms please seek medical attention immediately.

## 2017-01-14 NOTE — Progress Notes (Signed)
  Marikay Alar, MD Phone: [phone]  Lynn Henry is a 21 y.o. female who presents today for same-day visit.  Patient notes onset of symptoms yesterday. Started to have some sinus congestion and feel lightheaded in the morning. Notes she only feels lightheaded when she goes from laying down or seated to standing. Noted dull frontal headache with this. Notes postnasal drip and sinus pressure as well. Has had no fevers or cough. Then started to vomit some last night. Has thrown up 3 times. Nonbloody. Had diarrhea yesterday with no blood as well. No abdominal pain. Does note left lumbar paraspinous muscle discomfort as well that is sharp in nature at times. No radiation. No dysuria. No urinary changes. No sick contacts. No numbness or weakness. No vision changes.  ROS see history of present illness  Objective  Physical Exam Vitals:   01/14/17 1312  BP: 118/90  Pulse: (!) 110  Temp: 98.5 F (36.9 C)   Laying blood pressure 122/80 pulse 110 Sitting blood pressure 118/90 pulse 109 Standing blood pressure 126/82 pulse 134  BP Readings from Last 3 Encounters:  01/14/17 118/90  11/08/16 110/78  04/04/16 104/66   Wt Readings from Last 3 Encounters:  01/14/17 142 lb 3.2 oz (64.5 kg)  11/08/16 138 lb 9.6 oz (62.9 kg)  04/04/16 130 lb 12.8 oz (59.3 kg) (55 %, Z= 0.11)*   * Growth percentiles are based on CDC 2-20 Years data.    Physical Exam  Constitutional: No distress.  HENT:  Head: Normocephalic and atraumatic.  Mouth/Throat: Oropharynx is clear and moist. No oropharyngeal exudate.  Normal TMs  Eyes: Conjunctivae are normal. Pupils are equal, round, and reactive to light.  Neck: Neck supple.  Cardiovascular: Regular rhythm and normal heart sounds.  Tachycardia present.   Pulmonary/Chest: Effort normal and breath sounds normal.  Abdominal: Soft. Bowel sounds are normal. She exhibits no distension. There is no tenderness. There is no rebound and no guarding.    Musculoskeletal: She exhibits no edema.  No midline spine tenderness, no midline spine step-off, left paraspinous lumbar muscular tenderness with no overlying skin changes  Lymphadenopathy:    She has no cervical adenopathy.  Neurological: She is alert.  CN 2-12 intact, 5/5 strength in bilateral biceps, triceps, grip, quads, hamstrings, plantar and dorsiflexion, sensation to light touch intact in bilateral UE and LE, normal gait, 2+ patellar reflexes  Skin: Skin is dry. She is not diaphoretic.     Assessment/Plan: Please see individual problem list.  Viral illness Patient with constellation of symptoms most consistent with viral illness with upper respiratory congestion, vomiting, and diarrhea. She has a benign exam with the exception of tachycardia which is regular. She is typically slightly tachycardic in the past on review of vital signs and per her report. Benign abdominal exam. Benign neurological exam. Discussed staying well-hydrated. Advised on continuing her allergy medications for her upper respiratory symptoms. Discussed if she develops worsening lightheadedness or any change in her symptoms she should be evaluated. Given return precautions.  Marikay Alar, MD [facility]

## 2017-01-14 NOTE — Telephone Encounter (Signed)
atient Name: Lynn Henry DOB: 05-Aug-1996 Initial Comment Caller states she is experiencing some dizziness, nausea, headache, and congestion. Nurse Assessment Nurse: Charna Elizabeth, RN, Cathy Date/Time (Eastern Time): 01/14/2017 9:50:09 AM Confirm and document reason for call. If symptomatic, describe symptoms. ---Caller states she developed a headache yesterday morning (pain rated as a 4 on the 1 to 10 scale) and dizziness yesterday. No severe breathing difficulty. No fever. No injury in the past 3 days. Does the patient have any new or worsening symptoms? ---Yes Will a triage be completed? ---Yes Related visit to physician within the last 2 weeks? ---No Does the PT have any chronic conditions? (i.e. diabetes, asthma, etc.) ---Yes List chronic conditions. ---Allergies Is the patient pregnant or possibly pregnant? (Ask all females between the ages of 62-55) ---No Is this a behavioral health or substance abuse call? ---No Guidelines Guideline Title Affirmed Question Affirmed Notes Headache [1] Vomiting AND [2] 2 or more times (Exception: similar to previous migraines) Final Disposition User See Physician within 4 Hours (or PCP triage) Charna Elizabeth, RN, Cathy Comments Scheduled for 1:15pm appointment with Dr. Serena Croissant. Cancelled 3pm appointment. Referrals REFERRED TO PCP OFFICE Disagree/Comply: Comply

## 2017-01-14 NOTE — Assessment & Plan Note (Signed)
Patient with constellation of symptoms most consistent with viral illness with upper respiratory congestion, vomiting, and diarrhea. She has a benign exam with the exception of tachycardia which is regular. She is typically slightly tachycardic in the past on review of vital signs and per her report. Benign abdominal exam. Benign neurological exam. Discussed staying well-hydrated. Advised on continuing her allergy medications for her upper respiratory symptoms. Discussed if she develops worsening lightheadedness or any change in her symptoms she should be evaluated. Given return precautions.

## 2017-01-14 NOTE — Telephone Encounter (Signed)
Good morning!  Pt called in about having some dizziness/nausea/vomit last night. Pt is scheduled to see Arnett today. I also transferred pt to [facility]. Thank you!

## 2017-01-14 NOTE — Progress Notes (Signed)
Pre visit review using our clinic review tool, if applicable. No additional management support is needed unless otherwise documented below in the visit note. 

## 2017-01-15 ENCOUNTER — Telehealth: Payer: Self-pay | Admitting: *Deleted

## 2017-01-15 NOTE — Telephone Encounter (Signed)
Left voice mail to call back 

## 2017-01-15 NOTE — Telephone Encounter (Signed)
Pt stated that she missed a call from Olivia  Pt contact [phone]

## 2017-01-15 NOTE — Telephone Encounter (Signed)
Spoke with patient seen by Dr Birdie Sons 4/32/18 .

## 2017-01-15 NOTE — Telephone Encounter (Signed)
I haven't seen patient and do not see any appts  Can you call pt as courtesy and ensure she see's pcp or urgent care if needed?

## 2017-01-15 NOTE — Telephone Encounter (Signed)
See other message for documentation dated 01/15/17.

## 2017-06-24 DIAGNOSIS — J302 Other seasonal allergic rhinitis: Secondary | ICD-10-CM | POA: Insufficient documentation

## 2017-07-04 ENCOUNTER — Encounter: Payer: Self-pay | Admitting: Emergency Medicine

## 2017-07-04 ENCOUNTER — Emergency Department
Admission: EM | Admit: 2017-07-04 | Discharge: 2017-07-04 | Disposition: A | Discharge status: Home / Self Care | Payer: 59 | Attending: Emergency Medicine | Admitting: Emergency Medicine

## 2017-07-04 DIAGNOSIS — O219 Vomiting of pregnancy, unspecified: Secondary | ICD-10-CM | POA: Diagnosis not present

## 2017-07-04 DIAGNOSIS — Z79899 Other long term (current) drug therapy: Secondary | ICD-10-CM | POA: Diagnosis not present

## 2017-07-04 DIAGNOSIS — Z3401 Encounter for supervision of normal first pregnancy, first trimester: Secondary | ICD-10-CM | POA: Insufficient documentation

## 2017-07-04 DIAGNOSIS — Z3A01 Less than 8 weeks gestation of pregnancy: Secondary | ICD-10-CM | POA: Diagnosis not present

## 2017-07-04 DIAGNOSIS — Z87891 Personal history of nicotine dependence: Secondary | ICD-10-CM | POA: Insufficient documentation

## 2017-07-04 LAB — COMPREHENSIVE METABOLIC PANEL
ALBUMIN: 4.7 g/dL (ref 3.5–5.0)
ALT: 41 U/L (ref 14–54)
ANION GAP: 13 (ref 5–15)
AST: 32 U/L (ref 15–41)
Alkaline Phosphatase: 93 U/L (ref 38–126)
BUN: 8 mg/dL (ref 6–20)
CO2: 23 mmol/L (ref 22–32)
Calcium: 9.8 mg/dL (ref 8.9–10.3)
Chloride: 102 mmol/L (ref 101–111)
Creatinine, Ser: 0.75 mg/dL (ref 0.44–1.00)
GFR calc Af Amer: >60 mL/min (ref >60)
Glucose, Bld: 89 mg/dL (ref 65–99)
POTASSIUM: 3.6 mmol/L (ref 3.5–5.1)
Sodium: 138 mmol/L (ref 135–145)
TOTAL PROTEIN: 8 g/dL (ref 6.5–8.1)
Total Bilirubin: 0.8 mg/dL (ref 0.3–1.2)

## 2017-07-04 LAB — URINALYSIS, COMPLETE (UACMP) WITH MICROSCOPIC
BACTERIA UA: NONE SEEN
Bilirubin Urine: NEGATIVE
GLUCOSE, UA: NEGATIVE mg/dL
Hgb urine dipstick: NEGATIVE
Ketones, ur: NEGATIVE mg/dL
LEUKOCYTES UA: NEGATIVE
NITRITE: NEGATIVE
PH: 7 (ref 5.0–8.0)
PROTEIN: NEGATIVE mg/dL
RBC / HPF: NONE SEEN RBC/hpf (ref 0–5)
Specific Gravity, Urine: 1.006 (ref 1.005–1.030)

## 2017-07-04 LAB — CBC
HEMATOCRIT: 44.9 % (ref 35.0–47.0)
HEMOGLOBIN: 15.6 g/dL (ref 12.0–16.0)
MCH: 30.9 pg (ref 26.0–34.0)
MCHC: 34.8 g/dL (ref 32.0–36.0)
MCV: 88.8 fL (ref 80.0–100.0)
Platelets: 306 10*3/uL (ref 150–440)
RBC: 5.06 MIL/uL (ref 3.80–5.20)
RDW: 13.1 % (ref 11.5–14.5)
WBC: 8.7 10*3/uL (ref 3.6–11.0)

## 2017-07-04 LAB — POCT PREGNANCY, URINE: PREG TEST UR: POSITIVE — AB

## 2017-07-04 LAB — LIPASE, BLOOD: Lipase: 24 U/L (ref 11–51)

## 2017-07-04 MED ORDER — SODIUM CHLORIDE 0.9 % IV BOLUS (SEPSIS)
1000.0000 mL | Freq: Once | INTRAVENOUS | Status: AC
  Administered 2017-07-04: 1000 mL via INTRAVENOUS

## 2017-07-04 MED ORDER — PROMETHAZINE HCL 25 MG PO TABS: 25.0000 mg | ORAL_TABLET | Freq: Three times a day (TID) | ORAL | 0 refills | Status: DC | PRN

## 2017-07-04 MED ORDER — PROMETHAZINE HCL 25 MG/ML IJ SOLN
12.5000 mg | Freq: Once | INTRAMUSCULAR | Status: AC
  Administered 2017-07-04: 12.5 mg via INTRAMUSCULAR
  Filled 2017-07-04: qty 1

## 2017-07-04 MED ORDER — ONDANSETRON 4 MG PO TBDP: 4.0000 mg | ORAL_TABLET | Freq: Once | ORAL | Status: DC | PRN

## 2017-07-04 MED ORDER — DOXYLAMINE-PYRIDOXINE 10-10 MG PO TBEC: 2.0000 | DELAYED_RELEASE_TABLET | Freq: Every day | ORAL | 0 refills | Status: DC

## 2017-07-04 NOTE — Discharge Instructions (Signed)
°  Please return to the emergency room right away if you are to develop a fever, severe nausea, any vaginal bleeding or discharge, you begin to have abdominal pain, you are unable to keep food down, begin vomiting any dark or bloody fluid, you develop any dark or bloody stools, feel dehydrated, or other new concerns or symptoms arise.

## 2017-07-04 NOTE — ED Provider Notes (Signed)
[facility] Emergency Department Provider Note   ____________________________________________   First MD Initiated Contact with Patient 07/04/17 1157     (approximate)  I have reviewed the triage vital signs and the nursing notes.   HISTORY  Chief Complaint Emesis During Pregnancy    HPI Lynn Henry is a 21 y.o. female who reports she is about 6-1/[redacted] weeks pregnant. He is been experiencing frequent nausea, reports certain smells and foods make her feel very nauseated. She thrown up many times over the last 4 days. Reports that she threw up about 15 times last 24 hours, but did have a brief reprieve during the day where she was able to eat dinner and keep fluid down. She reports she called her OB this morning and they recommended she come in to have a bag of fluid given. She has follow-up planned with OB/GYN on Tuesday of this coming week in [place].  No previous pregnancies. Denies abdominal pain. No vaginal bleeding. No vaginal fluid discharge. No chest pain cough fevers or chills. Reports she believes she may have "morning sickness"   Past Medical History:  Diagnosis Date  . Chronic abdominal pain    presumed IBS  . IBS (irritable bowel syndrome)     Patient Active Problem List   Diagnosis Date Noted  . Viral illness 01/14/2017  . Pharyngitis 11/08/2016  . Cellulitis 04/02/2016  . Preventative health care 10/26/2015  . Seasonal allergies 10/26/2015  . Abdominal pain, chronic, epigastric 10/26/2015    Past Surgical History:  Procedure Laterality Date  . NO PAST SURGERIES      Prior to Admission medications   Medication Sig Start Date End Date Taking? Authorizing Provider  desloratadine (CLARINEX) 5 MG tablet Take 5 mg by mouth daily.    [provider]  JUNEL FE 1/20 1-20 MG-MCG tablet Take 1 tablet by mouth daily. 12/10/16   [provider]  montelukast (SINGULAIR) 10 MG tablet Take 10 mg by mouth at bedtime.     [provider]    Allergies Patient has no known allergies.  Family History  Problem Relation Age of Onset  . Hyperlipidemia Mother   . Hypertension Mother   . Prostate cancer Maternal Grandfather   . Hyperlipidemia Maternal Grandfather   . Heart disease Maternal Grandfather   . Stroke Maternal Grandfather   . Hypertension Maternal Grandfather   . Arthritis Paternal Grandmother   . Prostate cancer Paternal Grandfather   . Diabetes Paternal Grandfather     Social History Social History  Substance Use Topics  . Smoking status: Former Smoker    Packs/day: 0.50    Types: Cigarettes    Quit date: 06/19/2017  . Smokeless tobacco: Never Used  . Alcohol use No    Review of Systems Constitutional: No fever/chills Eyes: No visual changes. ENT: No sore throat. Cardiovascular: Denies chest pain. Respiratory: Denies shortness of breath. Gastrointestinal: No abdominal pain.   No diarrhea.  No constipation. Genitourinary: Negative for dysuria. Musculoskeletal: Negative for back pain. Skin: Negative for rash. Neurological: Negative for headaches, focal weakness or numbness.    ____________________________________________   PHYSICAL EXAM:  VITAL SIGNS: ED Triage Vitals  Enc Vitals Group     BP 07/04/17 1138 134/84     Pulse Rate 07/04/17 1138 (!) 112     Resp 07/04/17 1138 12     Temp 07/04/17 1138 98.3 F (36.8 C)     Temp Source 07/04/17 1138 Oral     SpO2 07/04/17  1138 97 %     Weight 07/04/17 1139 147 lb (66.7 kg)     Height 07/04/17 1139  (1.651 m)     Head Circumference --      Peak Flow --      Pain Score --      Pain Loc --      Pain Edu? --      Excl. in GC? --     Constitutional: Alert and oriented. Well appearing and in no acute distress. Eyes: Conjunctivae are normal. Head: Atraumatic. Nose: No congestion/rhinnorhea. Mouth/Throat: Mucous membranes are slightly dry to moist. Neck: No stridor.   Cardiovascular: Normal rate, regular  rhythm. Grossly normal heart sounds.  Good peripheral circulation. Respiratory: Normal respiratory effort.  No retractions. Lungs CTAB. Gastrointestinal: Soft and nontender. No distention. Does not feel gravid. No pain rebound or guarding in any quadrant. Musculoskeletal: No lower extremity tenderness nor edema. Neurologic:  Normal speech and language. No gross focal neurologic deficits are appreciated.  Skin:  Skin is warm, dry and intact. No rash noted. Psychiatric: Mood and affect are normal. Speech and behavior are normal.  ____________________________________________   LABS (all labs ordered are listed, but only abnormal results are displayed)  Labs Reviewed  POCT PREGNANCY, URINE - Abnormal; Notable for the following:       Result Value   Preg Test, Ur POSITIVE (*)    All other components within normal limits  LIPASE, BLOOD  COMPREHENSIVE METABOLIC PANEL  CBC  URINALYSIS, COMPLETE (UACMP) WITH MICROSCOPIC  POC URINE PREG, ED   ____________________________________________  EKG   ____________________________________________  RADIOLOGY   ____________________________________________   PROCEDURES  Procedure(s) performed: None  Procedures  Critical Care performed: No  ____________________________________________   INITIAL IMPRESSION / ASSESSMENT AND PLAN / ED COURSE  Pertinent labs & imaging results that were available during my care of the patient were reviewed by me and considered in my medical decision making (see chart for details).  Nausea and vomiting, intermittent but significant food aversion at times. Differential diagnosis includes enteritis, gastritis, so far less likely to represent molar pregnancy, no signs or symptoms of ectopic. No associated abdominal pain. Labs  reassuring Appears likely morning sickness, cannot exclude molar pregnancy, etc. at this early time but patient has appropriate follow-up planned on Tuesday. I have discussed with her and  we reviewed risks and benefits of different nausea medications in pregnancy including likely just, Zofran, Phenergan. We'll proceed with IM Phenergan, IV hydration.  No signs or symptoms of ectopic. No pain. No hemodynamic instability. Denies any vaginal discharge.  Clinical Course as of Jul 04 1348  Thu Jul 04, 2017  1236 Lab results reassuring. No ketones in the urine.  [MQ]  1348 Patient tolerating oral well. Nausea well controlled. Patient reports feeling much better.  [MQ]    Clinical Course User Index [MQ] Sharyn Creamer, MD    Return precautions and treatment recommendations and follow-up discussed with the patient who is agreeable with the plan.   ____________________________________________   FINAL CLINICAL IMPRESSION(S) / ED DIAGNOSES  Final diagnoses:  None      NEW MEDICATIONS STARTED DURING THIS VISIT:  New Prescriptions   No medications on file     Note:  This document was prepared using Dragon voice recognition software and may include unintentional dictation errors.     Sharyn Creamer, MD 07/04/17 1349

## 2017-07-04 NOTE — ED Triage Notes (Signed)
Pt c/o vomiting and nausea since Monday. No abdominal pain. No vaginal bleeding.  Pt reports about [redacted] weeks pregnant.  First OBGYN next week. Unable to keep anything down.  Nauseated but no vomiting currently.

## 2017-07-04 NOTE — ED Notes (Signed)
NAD noted at time of D/C. Pt denies questions or concerns. Pt ambulatory to the lobby at this time.  

## 2017-07-04 NOTE — ED Notes (Signed)
Patient presents to the ED with severe nausea and vomiting since Monday.  Patient reports vomiting/dry heaving x15 in the past 24 hours.  Patient reports being unable to keep liquids down.  Patient reports she is approx. 6.[redacted] weeks pregnant.  Denies abdominal pain and vaginal bleeding.  Alert and oriented x 4.  No obvious distress at this time.

## 2017-07-23 LAB — OB RESULTS CONSOLE ANTIBODY SCREEN: Antibody Screen: NEGATIVE

## 2017-07-23 LAB — OB RESULTS CONSOLE ABO/RH: RH Type: POSITIVE

## 2017-07-23 LAB — OB RESULTS CONSOLE RUBELLA ANTIBODY, IGM: Rubella: IMMUNE

## 2017-07-23 LAB — OB RESULTS CONSOLE HIV ANTIBODY (ROUTINE TESTING): HIV: NONREACTIVE

## 2017-07-23 LAB — OB RESULTS CONSOLE HEPATITIS B SURFACE ANTIGEN: Hepatitis B Surface Ag: NEGATIVE

## 2017-07-23 LAB — OB RESULTS CONSOLE RPR: RPR: NONREACTIVE

## 2017-09-24 NOTE — L&D Delivery Note (Signed)
Delivery Note At 4:04 PM a viable and healthy female was delivered via Vaginal, Spontaneous (Presentation: left occiput ; anterior ).  APGAR: 8, 9; weight pending .   Placenta status: spontaneous, intact.  Cord: 3V with loose nuchal x 2, reduced on the perineum  Anesthesia:  Epidural Episiotomy: None Lacerations: 3rd degree Suture Repair: 3.0 vicryl, 0 vicryl Est. Blood Loss (mL): 450  Mom to postpartum.  Baby to Couplet care / Skin to Skin.  [name] 02/17/2018, 4:42 PM

## 2018-01-29 LAB — OB RESULTS CONSOLE GC/CHLAMYDIA
CHLAMYDIA, DNA PROBE: NEGATIVE
GC PROBE AMP, GENITAL: NEGATIVE

## 2018-01-29 LAB — OB RESULTS CONSOLE GBS: GBS: NEGATIVE

## 2018-02-01 ENCOUNTER — Encounter (HOSPITAL_COMMUNITY): Payer: Self-pay

## 2018-02-01 ENCOUNTER — Inpatient Hospital Stay (HOSPITAL_COMMUNITY)
Admission: AD | Admit: 2018-02-01 | Discharge: 2018-02-01 | Disposition: A | Discharge status: Home / Self Care | Payer: BLUE CROSS/BLUE SHIELD | Source: Ambulatory Visit | Attending: Obstetrics and Gynecology | Admitting: Obstetrics and Gynecology

## 2018-02-01 DIAGNOSIS — Z3A36 36 weeks gestation of pregnancy: Secondary | ICD-10-CM | POA: Diagnosis not present

## 2018-02-01 DIAGNOSIS — O4703 False labor before 37 completed weeks of gestation, third trimester: Secondary | ICD-10-CM | POA: Diagnosis not present

## 2018-02-01 DIAGNOSIS — O4700 False labor before 37 completed weeks of gestation, unspecified trimester: Secondary | ICD-10-CM

## 2018-02-01 DIAGNOSIS — Z3403 Encounter for supervision of normal first pregnancy, third trimester: Secondary | ICD-10-CM | POA: Insufficient documentation

## 2018-02-01 LAB — AMNISURE RUPTURE OF MEMBRANE (ROM) NOT AT ARMC: AMNISURE: NEGATIVE

## 2018-02-01 LAB — POCT FERN TEST: POCT FERN TEST: NEGATIVE

## 2018-02-01 NOTE — MAU Provider Note (Addendum)
S: Ms. Lynn Henry is a 22 y.o. G1P0 at [redacted]w[redacted]d  who presents to MAU today complaining of leaking of fluid since 100 today. She describes intermittent watery discharge versus thick discharge yesterday. No gush. No antecedent intercourse. She denies vaginal bleeding or painful UCs. She reports normal fetal movement.    O: BP 131/86   Pulse (!) 125   Temp 97.7 F (36.5 C) (Oral)   Resp 18   Ht  (1.651 m)   Wt 184 lb 1.3 oz (83.5 kg)   LMP 05/19/2017   SpO2 100%   BMI 30.63 kg/m  GENERAL: Well-developed, well-nourished female in no acute distress.  HEAD: Normocephalic, atraumatic.  CHEST: Normal effort of breathing, regular heart rate ABDOMEN: Soft, nontender, gravid PELVIC: Normal external female genitalia. Vagina is pink and rugated. Cervix with normal contour, no lesions. Normal discharge.  Negative pooling.   Cervical exam:  Dilation: 1 Effacement (%): 40 Cervical Position: Posterior Exam by:: Latravion Graves, CNM   Fetal Monitoring: Baseline: 145-150 Variability: moderate Accelerations: presestn Decelerations: none Contractions: irregular  Results for orders placed or performed during the hospital encounter of 02/01/18 (from the past 24 hour(s))  Amnisure rupture of membrane (rom)not at [facility]     Status: None   Collection Time: 02/01/18  1:56 PM  Result Value Ref Range   Amnisure ROM NEGATIVE    Fern neg  A: SIUP at [redacted]w[redacted]d  Membranes intact, not in labor Fetal well-being by EFM P:  Report given to RN to contact MD on call for further instructions  Danae Orleans, CNM 02/01/2018 2:05 PM

## 2018-02-01 NOTE — MAU Note (Signed)
Urine in lab 

## 2018-02-01 NOTE — MAU Note (Signed)
Patient c/o  Trickle of clear fluid since 10am Not currently wearing a pad  Denies vaginal bleeding  +FM  +contractions Abdominal and back pain Cramping in nature Rating pain 1/10  Called her on call MD and they told her to come in.

## 2018-02-01 NOTE — Discharge Instructions (Signed)
Braxton Hicks Contractions °Contractions of the uterus can occur throughout pregnancy, but they are not always a sign that you are in labor. You may have practice contractions called Braxton Hicks contractions. These false labor contractions are sometimes confused with true labor. °What are Braxton Hicks contractions? °Braxton Hicks contractions are tightening movements that occur in the muscles of the uterus before labor. Unlike true labor contractions, these contractions do not result in opening (dilation) and thinning of the cervix. Toward the end of pregnancy (32-34 weeks), Braxton Hicks contractions can happen more often and may become stronger. These contractions are sometimes difficult to tell apart from true labor because they can be very uncomfortable. You should not feel embarrassed if you go to the hospital with false labor. °Sometimes, the only way to tell if you are in true labor is for your health care provider to look for changes in the cervix. The health care provider will do a physical exam and may monitor your contractions. If you are not in true labor, the exam should show that your cervix is not dilating and your water has not broken. °If there are other health problems associated with your pregnancy, it is completely safe for you to be sent home with false labor. You may continue to have Braxton Hicks contractions until you go into true labor. °How to tell the difference between true labor and false labor °True labor °· Contractions last 30-70 seconds. °· Contractions become very regular. °· Discomfort is usually felt in the top of the uterus, and it spreads to the lower abdomen and low back. °· Contractions do not go away with walking. °· Contractions usually become more intense and increase in frequency. °· The cervix dilates and gets thinner. °False labor °· Contractions are usually shorter and not as strong as true labor contractions. °· Contractions are usually irregular. °· Contractions  are often felt in the front of the lower abdomen and in the groin. °· Contractions may go away when you walk around or change positions while lying down. °· Contractions get weaker and are shorter-lasting as time goes on. °· The cervix usually does not dilate or become thin. °Follow these instructions at home: °· Take over-the-counter and prescription medicines only as told by your health care provider. °· Keep up with your usual exercises and follow other instructions from your health care provider. °· Eat and drink lightly if you think you are going into labor. °· If Braxton Hicks contractions are making you uncomfortable: °? Change your position from lying down or resting to walking, or change from walking to resting. °? Sit and rest in a tub of warm water. °? Drink enough fluid to keep your urine pale yellow. Dehydration may cause these contractions. °? Do slow and deep breathing several times an hour. °· Keep all follow-up prenatal visits as told by your health care provider. This is important. °Contact a health care provider if: °· You have a fever. °· You have continuous pain in your abdomen. °Get help right away if: °· Your contractions become stronger, more regular, and closer together. °· You have fluid leaking or gushing from your vagina. °· You pass blood-tinged mucus (bloody show). °· You have bleeding from your vagina. °· You have low back pain that you never had before. °· You feel your baby’s head pushing down and causing pelvic pressure. °· Your baby is not moving inside you as much as it used to. °Summary °· Contractions that occur before labor are called Braxton   Hicks contractions, false labor, or practice contractions. °· Braxton Hicks contractions are usually shorter, weaker, farther apart, and less regular than true labor contractions. True labor contractions usually become progressively stronger and regular and they become more frequent. °· Manage discomfort from Braxton Hicks contractions by  changing position, resting in a warm bath, drinking plenty of water, or practicing deep breathing. °This information is not intended to replace advice given to you by your health care provider. Make sure you discuss any questions you have with your health care provider. °Document Released: 01/24/2017 Document Revised: 01/24/2017 Document Reviewed: 01/24/2017 °Elsevier Interactive Patient Education © 2018 [employer] ° °

## 2018-02-16 ENCOUNTER — Inpatient Hospital Stay (HOSPITAL_COMMUNITY)
Admission: AD | Admit: 2018-02-16 | Discharge: 2018-02-19 | DRG: 768 | Disposition: A | Discharge status: Home / Self Care | Payer: BLUE CROSS/BLUE SHIELD | Attending: Obstetrics and Gynecology | Admitting: Obstetrics and Gynecology

## 2018-02-16 ENCOUNTER — Encounter (HOSPITAL_COMMUNITY): Payer: Self-pay

## 2018-02-16 ENCOUNTER — Other Ambulatory Visit: Payer: Self-pay

## 2018-02-16 DIAGNOSIS — O4292 Full-term premature rupture of membranes, unspecified as to length of time between rupture and onset of labor: Secondary | ICD-10-CM | POA: Diagnosis present

## 2018-02-16 DIAGNOSIS — D649 Anemia, unspecified: Secondary | ICD-10-CM | POA: Diagnosis present

## 2018-02-16 DIAGNOSIS — Z3490 Encounter for supervision of normal pregnancy, unspecified, unspecified trimester: Secondary | ICD-10-CM

## 2018-02-16 DIAGNOSIS — O9902 Anemia complicating childbirth: Secondary | ICD-10-CM | POA: Diagnosis present

## 2018-02-16 DIAGNOSIS — Z87891 Personal history of nicotine dependence: Secondary | ICD-10-CM | POA: Diagnosis not present

## 2018-02-16 DIAGNOSIS — Z3A39 39 weeks gestation of pregnancy: Secondary | ICD-10-CM

## 2018-02-16 LAB — CBC
HEMATOCRIT: 33.7 % — AB (ref 36.0–46.0)
HEMOGLOBIN: 10.8 g/dL — AB (ref 12.0–15.0)
MCH: 28.1 pg (ref 26.0–34.0)
MCHC: 32 g/dL (ref 30.0–36.0)
MCV: 87.5 fL (ref 78.0–100.0)
Platelets: 227 10*3/uL (ref 150–400)
RBC: 3.85 MIL/uL — AB (ref 3.87–5.11)
RDW: 14.1 % (ref 11.5–15.5)
WBC: 12.3 10*3/uL — AB (ref 4.0–10.5)

## 2018-02-16 LAB — TYPE AND SCREEN
ABO/RH(D): O POS
Antibody Screen: NEGATIVE

## 2018-02-16 LAB — AMNISURE RUPTURE OF MEMBRANE (ROM) NOT AT ARMC: AMNISURE: POSITIVE

## 2018-02-16 LAB — ABO/RH: ABO/RH(D): O POS

## 2018-02-16 LAB — POCT FERN TEST: POCT Fern Test: NEGATIVE

## 2018-02-16 MED ORDER — OXYTOCIN 40 UNITS IN LACTATED RINGERS INFUSION - SIMPLE MED
2.5000 [IU]/h | INTRAVENOUS | Status: DC
  Filled 2018-02-16 (×2): qty 1000

## 2018-02-16 MED ORDER — PHENYLEPHRINE 40 MCG/ML (10ML) SYRINGE FOR IV PUSH (FOR BLOOD PRESSURE SUPPORT)
80.0000 ug | PREFILLED_SYRINGE | INTRAVENOUS | Status: DC | PRN
Start: 2018-02-16 — End: 2018-02-17
  Filled 2018-02-16: qty 5
  Filled 2018-02-16: qty 10

## 2018-02-16 MED ORDER — ONDANSETRON HCL 4 MG/2ML IJ SOLN
4.0000 mg | Freq: Four times a day (QID) | INTRAMUSCULAR | Status: DC | PRN
  Administered 2018-02-17 (×2): 4 mg via INTRAVENOUS
  Filled 2018-02-16 (×2): qty 2

## 2018-02-16 MED ORDER — DIPHENHYDRAMINE HCL 50 MG/ML IJ SOLN
12.5000 mg | INTRAMUSCULAR | Status: DC | PRN
  Administered 2018-02-17 (×2): 12.5 mg via INTRAVENOUS
  Filled 2018-02-16 (×2): qty 1

## 2018-02-16 MED ORDER — PHENYLEPHRINE 40 MCG/ML (10ML) SYRINGE FOR IV PUSH (FOR BLOOD PRESSURE SUPPORT)
80.0000 ug | PREFILLED_SYRINGE | INTRAVENOUS | Status: DC | PRN
  Filled 2018-02-16: qty 10
  Filled 2018-02-16: qty 5

## 2018-02-16 MED ORDER — FENTANYL 2.5 MCG/ML BUPIVACAINE 1/10 % EPIDURAL INFUSION (WH - ANES)
14.0000 mL/h | INTRAMUSCULAR | Status: DC | PRN
  Administered 2018-02-17 (×3): 14 mL/h via EPIDURAL
  Filled 2018-02-16 (×3): qty 100

## 2018-02-16 MED ORDER — ACETAMINOPHEN 325 MG PO TABS: 650.0000 mg | ORAL_TABLET | ORAL | Status: DC | PRN

## 2018-02-16 MED ORDER — LACTATED RINGERS IV SOLN
INTRAVENOUS | Status: DC
  Administered 2018-02-16 – 2018-02-17 (×4): via INTRAVENOUS

## 2018-02-16 MED ORDER — LIDOCAINE HCL (PF) 1 % IJ SOLN
30.0000 mL | INTRAMUSCULAR | Status: AC | PRN
  Administered 2018-02-17: 30 mL via SUBCUTANEOUS
  Filled 2018-02-16: qty 30

## 2018-02-16 MED ORDER — OXYTOCIN 40 UNITS IN LACTATED RINGERS INFUSION - SIMPLE MED
1.0000 m[IU]/min | INTRAVENOUS | Status: DC
  Administered 2018-02-16: 2 m[IU]/min via INTRAVENOUS
  Administered 2018-02-17: 16 m[IU]/min via INTRAVENOUS

## 2018-02-16 MED ORDER — SOD CITRATE-CITRIC ACID 500-334 MG/5ML PO SOLN
30.0000 mL | ORAL | Status: DC | PRN
  Administered 2018-02-17: 30 mL via ORAL
  Filled 2018-02-16: qty 15

## 2018-02-16 MED ORDER — TERBUTALINE SULFATE 1 MG/ML IJ SOLN
0.2500 mg | Freq: Once | INTRAMUSCULAR | Status: DC | PRN
Start: 2018-02-16 — End: 2018-02-17
  Filled 2018-02-16: qty 1

## 2018-02-16 MED ORDER — OXYCODONE-ACETAMINOPHEN 5-325 MG PO TABS: 2.0000 | ORAL_TABLET | ORAL | Status: DC | PRN

## 2018-02-16 MED ORDER — EPHEDRINE 5 MG/ML INJ
10.0000 mg | INTRAVENOUS | Status: DC | PRN
  Filled 2018-02-16: qty 2

## 2018-02-16 MED ORDER — OXYTOCIN BOLUS FROM INFUSION
500.0000 mL | Freq: Once | INTRAVENOUS | Status: AC
  Administered 2018-02-17: 500 mL via INTRAVENOUS

## 2018-02-16 MED ORDER — LACTATED RINGERS IV SOLN
500.0000 mL | Freq: Once | INTRAVENOUS | Status: AC
  Administered 2018-02-17: 500 mL via INTRAVENOUS

## 2018-02-16 MED ORDER — LACTATED RINGERS IV SOLN: 500.0000 mL | INTRAVENOUS | Status: DC | PRN

## 2018-02-16 MED ORDER — OXYCODONE-ACETAMINOPHEN 5-325 MG PO TABS: 1.0000 | ORAL_TABLET | ORAL | Status: DC | PRN

## 2018-02-16 NOTE — Progress Notes (Addendum)
G1 @ [redacted] wksga. Here dt SROM this morning at 0900, clear. States she was using bathroom and when got up felt a small gush. when putting underwear on, had a small leak. Denies bleeding. +FM.   EFM applied  See flowsheet for VS. Maternal tachycardia noted.   Fern test: negative  SVE 3/40/-3  Provider at bs for pelvic/speculum exam with tech. amnisure done.  amnisure +.   1342: Dr. Claiborne Billings notified. Report given. Order's received to place routine orders.   Birthing charge notified. Room assigned to 167  1348: routine orders placed. Pt to birthing via wheelchair.

## 2018-02-16 NOTE — H&P (Signed)
22 y.o. [redacted]w[redacted]d  G1P0 comes in c/o LOF since approx 9am.  Otherwise has good fetal movement and no bleeding.  Past Medical History:  Diagnosis Date  . Chronic abdominal pain    presumed IBS  . IBS (irritable bowel syndrome)     Past Surgical History:  Procedure Laterality Date  . TOOTH EXTRACTION      OB History  Gravida Para Term Preterm AB Living  1            SAB TAB Ectopic Multiple Live Births               # Outcome Date GA Lbr Len/2nd Weight Sex Delivery Anes PTL Lv  1 Current             Social History   Socioeconomic History  . Marital status: Married    Spouse name: Not on file  . Number of children: Not on file  . Years of education: Not on file  . Highest education level: Not on file  Occupational History  . Not on file  Social Needs  . Financial resource strain: Not on file  . Food insecurity:    Worry: Not on file    Inability: Not on file  . Transportation needs:    Medical: Not on file    Non-medical: Not on file  Tobacco Use  . Smoking status: Former Smoker    Packs/day: 0.50    Types: Cigarettes    Last attempt to quit: 06/19/2017    Years since quitting: 0.6  . Smokeless tobacco: Never Used  Substance and Sexual Activity  . Alcohol use: No    Alcohol/week: 0.0 oz  . Drug use: No  . Sexual activity: Yes    Birth control/protection: None  Lifestyle  . Physical activity:    Days per week: Not on file    Minutes per session: Not on file  . Stress: Not on file  Relationships  . Social connections:    Talks on phone: Not on file    Gets together: Not on file    Attends religious service: Not on file    Active member of club or organization: Not on file    Attends meetings of clubs or organizations: Not on file    Relationship status: Not on file  . Intimate partner violence:    Fear of current or ex partner: Not on file    Emotionally abused: Not on file    Physically abused: Not on file    Forced sexual activity: Not on file  Other  Topics Concern  . Not on file  Social History Narrative  . Not on file   Patient has no known allergies.    Prenatal Transfer Tool  Maternal Diabetes: No Genetic Screening: Normal Maternal Ultrasounds/Referrals: Abnormal:  Findings:   Isolated choroid plexus cyst Fetal Ultrasounds or other Referrals:  None Maternal Substance Abuse:  No Significant Maternal Medications:  None Significant Maternal Lab Results: GBS Neg  Other PNC: uncomplicated, but excessive weight gain in pregnancy >50#    Vitals:   02/16/18 1701 02/16/18 1702 02/16/18 1733 02/16/18 1759  BP:  136/88 135/90 127/79  Pulse:  100 (!) 102 (!) 113  Resp:  Temp: 99 F (37.2 C)     TempSrc:      SpO2:      Weight:      Height:        Lungs/Cor:  NAD Abdomen:  soft, gravid  Ex:  no cords, erythema SVE:  2.5/70/-2 per MAU FHTs:  140, good STV, NST R Toco:  q2-4   A/P   Admit with PROM  Pit 2x2  Epidural when desired  GBS NEg  Other routine care  [place], [facility]

## 2018-02-16 NOTE — Anesthesia Pain Management Evaluation Note (Signed)
  CRNA Pain Management Visit Note  Patient: Lynn Henry, 22 y.o., female  "Hello I am a member of the anesthesia team at [facility]. We have an anesthesia team available at all times to provide care throughout the hospital, including epidural management and anesthesia for C-section. I don't know your plan for the delivery whether it a natural birth, water birth, IV sedation, nitrous supplementation, doula or epidural, but we want to meet your pain goals."   1.Was your pain managed to your expectations on prior hospitalizations?   Yes   2.What is your expectation for pain management during this hospitalization?     Epidural  3.How can we help you reach that goal? epidural  Record the patient's initial score and the patient's pain goal.   Pain: 3/10  Pain Goal: 3/10 [facility] wants you to be able to say your pain was always managed very well.  Salome Arnt 02/16/2018

## 2018-02-16 NOTE — MAU Provider Note (Signed)
S: Ms. CECEILIA CEPHUS is a 22 y.o. G1P0 at [redacted]w[redacted]d  who presents to MAU today complaining of leaking of fluid since 0900. She reports she used the BR and felt "a big gush" after she got up from using the BR. She also reports that her underwear had a "pretty big" spot of fluid on them afterwards as well. She denies vaginal bleeding. She endorses contractions. She reports normal fetal movement.    O: BP 122/81   Pulse (!) 126   Temp 98.6 F (37 C) (Oral)   Resp 18   LMP 05/19/2017   SpO2 100%  GENERAL: Well-developed, well-nourished female in no acute distress.  HEAD: Normocephalic, atraumatic.  CHEST: Normal effort of breathing, regular heart rate ABDOMEN: Soft, nontender, gravid PELVIC: Normal external female genitalia. Vagina is pink and rugated. Cervix with normal contour, no lesions. Normal discharge.  (+) pooling.   Cervical exam:  Dilation: 3 Effacement (%): 40 Station: -3 Presentation: Vertex Exam by:: Charna Archer, RNC   Fetal Monitoring: Baseline: 145 bpm Variability: moderate Accelerations: present Decelerations: absent Contractions: 2-6  Results for orders placed or performed during the hospital encounter of 02/16/18 (from the past 24 hour(s))  Amnisure rupture of membrane (rom)not at [facility]     Status: None   Collection Time: 02/16/18 12:48 PM  Result Value Ref Range   Amnisure ROM POSITIVE      A: SIUP at [redacted]w[redacted]d  SROM  P: Report given to RN to contact MD on call for further instructions  Raelyn Mora, CNM 02/16/2018, 12:46 PM

## 2018-02-17 ENCOUNTER — Encounter (HOSPITAL_COMMUNITY): Payer: Self-pay | Admitting: Anesthesiology

## 2018-02-17 ENCOUNTER — Inpatient Hospital Stay (HOSPITAL_COMMUNITY): Payer: BLUE CROSS/BLUE SHIELD | Admitting: Anesthesiology

## 2018-02-17 LAB — RPR: RPR: NONREACTIVE

## 2018-02-17 MED ORDER — DIBUCAINE 1 % RE OINT
1.0000 "application " | TOPICAL_OINTMENT | RECTAL | Status: DC | PRN
  Administered 2018-02-19: 1 via RECTAL
  Filled 2018-02-17: qty 28

## 2018-02-17 MED ORDER — OXYCODONE HCL 5 MG PO TABS
5.0000 mg | ORAL_TABLET | ORAL | Status: DC | PRN
  Administered 2018-02-17 – 2018-02-19 (×7): 5 mg via ORAL
  Filled 2018-02-17 (×7): qty 1

## 2018-02-17 MED ORDER — SENNOSIDES-DOCUSATE SODIUM 8.6-50 MG PO TABS
2.0000 | ORAL_TABLET | ORAL | Status: DC
  Administered 2018-02-17 – 2018-02-18 (×2): 2 via ORAL
  Filled 2018-02-17 (×2): qty 2

## 2018-02-17 MED ORDER — ZOLPIDEM TARTRATE 5 MG PO TABS: 5.0000 mg | ORAL_TABLET | Freq: Every evening | ORAL | Status: DC | PRN

## 2018-02-17 MED ORDER — MONTELUKAST SODIUM 10 MG PO TABS
10.0000 mg | ORAL_TABLET | Freq: Every day | ORAL | Status: DC
  Administered 2018-02-17 – 2018-02-18 (×2): 10 mg via ORAL
  Filled 2018-02-17 (×3): qty 1

## 2018-02-17 MED ORDER — PRENATAL MULTIVITAMIN CH
1.0000 | ORAL_TABLET | Freq: Every day | ORAL | Status: DC
  Administered 2018-02-18 – 2018-02-19 (×2): 1 via ORAL
  Filled 2018-02-17 (×2): qty 1

## 2018-02-17 MED ORDER — METHYLERGONOVINE MALEATE 0.2 MG PO TABS: 0.2000 mg | ORAL_TABLET | ORAL | Status: DC | PRN

## 2018-02-17 MED ORDER — COCONUT OIL OIL
1.0000 "application " | TOPICAL_OIL | Status: DC | PRN
  Administered 2018-02-18: 1 via TOPICAL
  Filled 2018-02-17: qty 120

## 2018-02-17 MED ORDER — BENZOCAINE-MENTHOL 20-0.5 % EX AERO
1.0000 "application " | INHALATION_SPRAY | CUTANEOUS | Status: DC | PRN
  Administered 2018-02-17 – 2018-02-19 (×2): 1 via TOPICAL
  Filled 2018-02-17 (×2): qty 56

## 2018-02-17 MED ORDER — ONDANSETRON HCL 4 MG PO TABS
4.0000 mg | ORAL_TABLET | ORAL | Status: DC | PRN
Start: 2018-02-17 — End: 2018-02-19

## 2018-02-17 MED ORDER — DIPHENHYDRAMINE HCL 25 MG PO CAPS: 25.0000 mg | ORAL_CAPSULE | Freq: Four times a day (QID) | ORAL | Status: DC | PRN

## 2018-02-17 MED ORDER — ONDANSETRON HCL 4 MG/2ML IJ SOLN: 4.0000 mg | INTRAMUSCULAR | Status: DC | PRN

## 2018-02-17 MED ORDER — METHYLERGONOVINE MALEATE 0.2 MG/ML IJ SOLN: 0.2000 mg | INTRAMUSCULAR | Status: DC | PRN

## 2018-02-17 MED ORDER — OXYCODONE HCL 5 MG PO TABS
10.0000 mg | ORAL_TABLET | ORAL | Status: DC | PRN
  Administered 2018-02-19: 10 mg via ORAL
  Filled 2018-02-17: qty 2

## 2018-02-17 MED ORDER — WITCH HAZEL-GLYCERIN EX PADS
1.0000 "application " | MEDICATED_PAD | CUTANEOUS | Status: DC | PRN
  Administered 2018-02-17: 1 via TOPICAL

## 2018-02-17 MED ORDER — ACETAMINOPHEN 325 MG PO TABS
650.0000 mg | ORAL_TABLET | ORAL | Status: DC | PRN
  Administered 2018-02-17: 650 mg via ORAL
  Filled 2018-02-17: qty 2

## 2018-02-17 MED ORDER — IBUPROFEN 600 MG PO TABS
600.0000 mg | ORAL_TABLET | Freq: Four times a day (QID) | ORAL | Status: DC
  Administered 2018-02-17 – 2018-02-19 (×8): 600 mg via ORAL
  Filled 2018-02-17 (×8): qty 1

## 2018-02-17 MED ORDER — SIMETHICONE 80 MG PO CHEW: 80.0000 mg | CHEWABLE_TABLET | ORAL | Status: DC | PRN

## 2018-02-17 MED ORDER — LIDOCAINE HCL (PF) 1 % IJ SOLN
INTRAMUSCULAR | Status: DC | PRN
  Administered 2018-02-17 (×2): 6 mL via EPIDURAL

## 2018-02-17 MED ORDER — TETANUS-DIPHTH-ACELL PERTUSSIS 5-2.5-18.5 LF-MCG/0.5 IM SUSP: 0.5000 mL | Freq: Once | INTRAMUSCULAR | Status: DC

## 2018-02-17 NOTE — Progress Notes (Signed)
Patient ID: Lynn Henry, female   DOB: 1995-12-08, 22 y.o.   MRN: [id number]  S: Pt comfortable with epidural O: AFVSS FHR 130 reactive, cat 1 tracing cvx 5-6/80/-2 toco Q1-2  IUPC placed  A/P 1) Continue pit 2) FWB reassuring 3) Will assess strength of contractions with IUPC

## 2018-02-17 NOTE — Anesthesia Postprocedure Evaluation (Signed)
Anesthesia Post Note  Patient: Lynn Henry  Procedure(s) Performed: AN AD HOC LABOR EPIDURAL     Patient location during evaluation: Mother Baby Anesthesia Type: Epidural Level of consciousness: awake and alert Pain management: pain level controlled Vital Signs Assessment: post-procedure vital signs reviewed and stable Respiratory status: spontaneous breathing, nonlabored ventilation and respiratory function stable Cardiovascular status: stable Postop Assessment: no headache, no backache, epidural receding, no apparent nausea or vomiting, patient able to bend at knees, able to ambulate and adequate PO intake Anesthetic complications: no    Last Vitals:  Vitals:   02/17/18 1820 02/17/18 1915  BP: 122/84 123/80  Pulse: 89 96  Resp: 18 18  Temp: 36.9 C 36.7 C  SpO2:  99%    Last Pain:  Vitals:   02/17/18 1915  TempSrc: Oral  PainSc: 5    Pain Goal:                 [name]

## 2018-02-17 NOTE — Anesthesia Preprocedure Evaluation (Signed)
Anesthesia Evaluation  Patient identified by MRN, date of birth, ID band Patient awake    Reviewed: Allergy & Precautions, H&P , Patient's Chart, lab work & pertinent test results  Airway Mallampati: I  TM Distance: >3 FB Neck ROM: full    Dental no notable dental hx.    Pulmonary former smoker,    Pulmonary exam normal breath sounds clear to auscultation       Cardiovascular negative cardio ROS Normal cardiovascular exam Rhythm:regular Rate:Normal     Neuro/Psych negative neurological ROS  negative psych ROS   GI/Hepatic negative GI ROS, Neg liver ROS,   Endo/Other  negative endocrine ROS  Renal/GU negative Renal ROS  negative genitourinary   Musculoskeletal negative musculoskeletal ROS (+)   Abdominal (+) + obese,   Peds  Hematology  (+) anemia ,   Anesthesia Other Findings   Reproductive/Obstetrics (+) Pregnancy                             Anesthesia Physical Anesthesia Plan  ASA: II  Anesthesia Plan: Epidural   Post-op Pain Management:    Induction:   PONV Risk Score and Plan:   Airway Management Planned:   Additional Equipment:   Intra-op Plan:   Post-operative Plan:   Informed Consent: I have reviewed the patients History and Physical, chart, labs and discussed the procedure including the risks, benefits and alternatives for the proposed anesthesia with the patient or authorized representative who has indicated his/her understanding and acceptance.     Plan Discussed with:   Anesthesia Plan Comments:         Anesthesia Quick Evaluation

## 2018-02-17 NOTE — Anesthesia Procedure Notes (Signed)
Epidural Patient location during procedure: OB Start time: 02/17/2018 12:28 AM End time: 02/17/2018 12:31 AM  Staffing Anesthesiologist: Leilani Able, MD Performed: anesthesiologist   Preanesthetic Checklist Completed: patient identified, site marked, surgical consent, pre-op evaluation, timeout performed, IV checked, risks and benefits discussed and monitors and equipment checked  Epidural Patient position: sitting Prep: site prepped and draped and DuraPrep Patient monitoring: continuous pulse ox and blood pressure Approach: midline Location: L3-L4 Injection technique: LOR air  Needle:  Needle type: Tuohy  Needle gauge: 17 G Needle length: 9 cm and 9 Needle insertion depth: 4 cm Catheter type: closed end flexible Catheter size: 19 Gauge Catheter at skin depth: 9 cm Test dose: negative and Other  Assessment Sensory level: T9 Events: blood not aspirated, injection not painful, no injection resistance, negative IV test and no paresthesia  Additional Notes Reason for block:procedure for pain

## 2018-02-17 NOTE — Progress Notes (Signed)
Patient ID: Lynn Henry, female   DOB: 10-03-1995, 22 y.o.   MRN: [id number]  S: Comfortable O AFVSS FHR 140 reactive, cat 1 tracing Cvx anterior rim toco Q2-4  A/P 1) FWB reassuring 2) Continue pitocin

## 2018-02-18 LAB — CBC
HEMATOCRIT: 22 % — AB (ref 36.0–46.0)
HEMOGLOBIN: 7.2 g/dL — AB (ref 12.0–15.0)
MCH: 28.1 pg (ref 26.0–34.0)
MCHC: 32.7 g/dL (ref 30.0–36.0)
MCV: 85.9 fL (ref 78.0–100.0)
Platelets: 179 10*3/uL (ref 150–400)
RBC: 2.56 MIL/uL — ABNORMAL LOW (ref 3.87–5.11)
RDW: 14.3 % (ref 11.5–15.5)
WBC: 15.3 10*3/uL — AB (ref 4.0–10.5)

## 2018-02-18 MED ORDER — FERROUS SULFATE 325 (65 FE) MG PO TABS
325.0000 mg | ORAL_TABLET | Freq: Three times a day (TID) | ORAL | Status: DC
  Administered 2018-02-18 – 2018-02-19 (×5): 325 mg via ORAL
  Filled 2018-02-18 (×5): qty 1

## 2018-02-18 NOTE — Lactation Note (Signed)
This note was copied from a baby's chart. Lactation Consultation Note  Patient Name: Lynn Henry [id number] Date: 02/18/2018 Reason for consult: Follow-up assessment;Nipple pain/trauma   Baby 23 hours old and mother is still complaining of nipple soreness. Her nipples are semi flat but with stimulation and latching nipples evert fully. Mother has coconut oil and shells.  Suggest alternating with comfort gels and coconut oil/shells. Had mother prepump before latching. Baby latched in cross cradle/laid back and football. She was able to sustain latch w/ football hold w/ chin tug. Mother still complaining of soreness and has recently been given pain medication. Will suggest mother set up DEBP.   Maternal Data Has patient been taught Hand Expression?: Yes Does the patient have breastfeeding experience prior to this delivery?: No  Feeding Feeding Type: Breast Fed Length of feed: 5 min  LATCH Score Latch: (asked mom to call with next latch )                 Interventions    Lactation Tools Discussed/Used     Consult Status Consult Status: Follow-up Date: 02/19/18 Follow-up type: In-patient    [name] [facility] 02/18/2018, 3:23 PM

## 2018-02-18 NOTE — Lactation Note (Signed)
This note was copied from a baby's chart. Lactation Consultation Note Baby 31 hrs old. Mom having severe pain when latching. Mom has small very short shaft nipples. Mom has bilateral stripes w/pinched nipples. Bruises to areolas. Mom has BF w/w/o NS. Has #20 NS. Fitted w/#16 NS. Saw lots of colostrum in NS. Mom states pain better w/NS than w/o. Can tell slight less pain w/#16 NS.  Encouraged to try #20 NS again to see which feels the best. Encouraged to wear shells and comfort gels after feedings. Demonstrated chin and lip tug and flange.  Baby has high palate, thick labial frenulum. Baby can raise tongue. When sucking on gloved finger, looses suction. Doesn't loose suction on breast. Taught "C" hold when latching and until baby feeding well. Breast feel heavy. Mom has generalized edema. Encouraged mom to take pain medication as ordered and deep breathe while BF. Mom states sees more colostrum in #16 NS. Noted transitional milk. Stressed importance of cheeks to breast while feeding. Praised mom.  Mom has good family support. FOB taught to flange lips and chin tug. Patient Name: Lynn Henry [id number] Date: 02/18/2018 Reason for consult: Mother's request;Difficult latch;Nipple pain/trauma   Maternal Data    Feeding Feeding Type: Breast Fed Length of feed: 15 min  LATCH Score Latch: Grasps breast easily, tongue down, lips flanged, rhythmical sucking.  Audible Swallowing: Spontaneous and intermittent  Type of Nipple: Everted at rest and after stimulation(very short shaft)  Comfort (Breast/Nipple): Engorged, cracked, bleeding, large blisters, severe discomfort  Hold (Positioning): Assistance needed to correctly position infant at breast and maintain latch.  LATCH Score: 7  Interventions Interventions: Breast feeding basics reviewed;Support pillows;Assisted with latch;Position options;Breast massage;Coconut oil;Shells;Comfort gels;Breast compression;Adjust  position  Lactation Tools Discussed/Used Tools: Shells;Pump;Coconut oil;Comfort gels;Nipple Shields Nipple shield size: 16;20 Shell Type: Inverted   Consult Status Consult Status: Follow-up Date: 02/19/18 Follow-up type: In-patient    Charyl Dancer 02/18/2018, 11:52 PM

## 2018-02-18 NOTE — Lactation Note (Signed)
This note was copied from a baby's chart. Lactation Consultation Note  Patient Name: Lynn Henry [id number] Date: 02/18/2018   Mother sleeping; will attempt to return later.      Lynn Henry 02/18/2018, 1:03 AM

## 2018-02-18 NOTE — Lactation Note (Signed)
This note was copied from a baby's chart. Lactation Consultation Note  Patient Name: Lynn Henry [id number] Date: 02/18/2018 Reason for consult: Initial assessment;1st time breastfeeding;Primapara;Term;Nipple pain/trauma   P1 mother whose infant is now 26 hours old.  Mother has taken a breastfeeding class.  Infant awake, alert and quiet as I entered.  Offered to assist mother with latch and she accepted.  Mother's breasts are soft and non tender.  Her nipples are short shafted bilaterally with the left nipple reddened and irritated.  Mother is extremely sensitive to baby latching onto the left breast. I allowed infant to suck on my gloved finger to assess suck.  He tended to tongue thrust and to keep his tongue up high to the palate.  Allowed him a few minutes to relax and to bring the tongue down and forward with his suck.   Due to her nipple appearance I provided a #20 NS with instructions for use.  Assisted to latch infant in the football hold on the left breast.  After repeated attempts he was able to latch and maintain the latch.  After 10 minutes there was colostrum in the NS. After releasing from the left breast I helped her latch baby onto the right breast.  Again, there was colostrum in the NS after 10 minutes of sucking.    Provided breast shells and a manual pump with instructions for use.  Explained the cleaning and care of the pump and mother will begin wearing the breast shells later this morning.  Coconut oil applied to nipples and areola.    Encouraged to feed baby 8-12 times in 24 hours or earlier if baby shows feeding cues.  Continue STS and feed back any EBM mother may obtain during pumping.  Mother will call for assistance as needed.  RN updated.  Grandmother will assist throughout the night. Mom made aware of O/P services, breastfeeding support groups, community resources, and our phone # for post-discharge questions. Mom made aware of O/P services, breastfeeding support  groups, community resources, and our phone # for post-discharge questions.    Maternal Data Formula Feeding for Exclusion: No Has patient been taught Hand Expression?: Yes Does the patient have breastfeeding experience prior to this delivery?: No  Feeding Feeding Type: Breast Fed Length of feed: 20 min  LATCH Score Latch: Repeated attempts needed to sustain latch, nipple held in mouth throughout feeding, stimulation needed to elicit sucking reflex.  Audible Swallowing: A few with stimulation  Type of Nipple: Everted at rest and after stimulation(very short shafted bilaterally;almost flat)  Comfort (Breast/Nipple): Filling, red/small blisters or bruises, mild/mod discomfort  Hold (Positioning): Assistance needed to correctly position infant at breast and maintain latch.  LATCH Score: 6  Interventions Interventions: Breast feeding basics reviewed;Assisted with latch;Skin to skin;Breast massage;Hand express;Position options;Support pillows;Adjust position;Breast compression;Coconut oil;Shells;Hand pump  Lactation Tools Discussed/Used Tools: Shells;Pump;Flanges;Coconut oil;Nipple Shields Nipple shield size: 20 Flange Size: 24 Shell Type: Inverted Breast pump type: Manual Pump Review: Setup, frequency, and cleaning;Milk Storage Initiated by:: Kazandra Forstrom Date initiated:: 02/18/18   Consult Status Consult Status: Follow-up Date: 02/19/18 Follow-up type: In-patient    Dory Demont R Divine Imber 02/18/2018, 4:49 AM

## 2018-02-18 NOTE — Progress Notes (Signed)
Patient is eating, ambulating, voiding.  Pain control is good.  Vitals:   02/17/18 1820 02/17/18 1915 02/17/18 2333 02/18/18 0559  BP: 122/84 123/80 124/86 110/75  Pulse: 89 96 100 98  Resp: Temp: 98.4 F (36.9 C) 98.1 F (36.7 C) 98.1 F (36.7 C) 98.3 F (36.8 C)  TempSrc: Oral Oral Oral Oral  SpO2:  99% 99%   Weight:      Height:        Fundus firm Perineum without swelling.  Lab Results  Component Value Date   WBC 15.3 (H) 02/18/2018   HGB 7.2 (L) 02/18/2018   HCT 22.0 (L) 02/18/2018   MCV 85.9 02/18/2018   PLT 179 02/18/2018    --/--/O POS, O POS Performed at [facility], [address], [place], [place] [address]  (05/26 1410)/RI  A/P Post partum day 1.  Routine care.  Expect d/c routine.  Anemia precautions, iron.  [place] Parents desires circumsision.  All risks, benefits and alternatives discussed with the mother.  Benedetta Sundstrom A

## 2018-02-19 MED ORDER — OXYCODONE HCL 10 MG PO TABS: 10.0000 mg | ORAL_TABLET | ORAL | 0 refills | Status: DC | PRN

## 2018-02-19 NOTE — Progress Notes (Signed)
Pt c/o pain in upper back when eating and drinking. Reports is passing little flatus. Encouraged pt to ambulate in hallway and drink warm fluids. Offered simethicone, pt states just took a Tums her mother gave her. Medicated with oxy IR per pt request.

## 2018-02-19 NOTE — Lactation Note (Signed)
This note was copied from a baby's chart. Lactation Consultation Note  Patient Name: Lynn Henry [id number] Date: 02/19/2018 Reason for consult: Follow-up assessment;Nipple pain/trauma Mom states pain has improved greatly over night using a 16 mm nipple shield.  Wearing comfort gels between feedings.  Discharge instructions reviewed including engorgement treatment.  Lactation outpatient services and support information reviewed and encouraged prn.  Maternal Data    Feeding Feeding Type: Breast Fed Length of feed: 25 min  LATCH Score                   Interventions    Lactation Tools Discussed/Used     Consult Status Consult Status: Complete Follow-up type: Call as needed    Huston Foley 02/19/2018, 8:58 AM

## 2018-02-19 NOTE — Discharge Summary (Signed)
Obstetric Discharge Summary Reason for Admission: rupture of membranes Prenatal Procedures: ultrasound Intrapartum Procedures: spontaneous vaginal delivery Postpartum Procedures: none Complications-Operative and Postpartum: 3rd degree perineal laceration Hemoglobin  Date Value Ref Range Status  02/18/2018 7.2 (L) 12.0 - 15.0 g/dL Final    Comment:    DELTA CHECK NOTED REPEATED TO VERIFY    HCT  Date Value Ref Range Status  02/18/2018 22.0 (L) 36.0 - 46.0 % Final    Physical Exam:  General: alert and cooperative Lochia: appropriate Uterine Fundus: firm Incision: healing well DVT Evaluation: No evidence of DVT seen on physical exam.  Discharge Diagnoses: Term Pregnancy-delivered  Discharge Information: Date: 02/19/2018 Activity: pelvic rest Diet: routine Medications: PNV, Ibuprofen and Percocet, iron Condition: stable Instructions: refer to practice specific booklet Discharge to: home Follow-up Information    Lynn Reeds, MD Follow up in 4 week(s).   Specialty:  Obstetrics and Gynecology Contact information: [address] [place] [address] [phone]           Newborn Data: Live born female  Birth Weight: 8 lb 14.5 oz (4040 g) APGAR: 8, 9  Newborn Delivery   Birth date/time:  02/17/2018 16:04:00 Delivery type:  Vaginal, Spontaneous     Home with mother.  Lynn Henry 02/19/2018, 10:19 AM

## 2018-02-23 ENCOUNTER — Inpatient Hospital Stay (HOSPITAL_COMMUNITY)
Admission: AD | Admit: 2018-02-23 | Discharge: 2018-02-25 | DRG: 776 | Disposition: A | Discharge status: Home / Self Care | Payer: BLUE CROSS/BLUE SHIELD | Source: Ambulatory Visit | Attending: Obstetrics and Gynecology | Admitting: Obstetrics and Gynecology

## 2018-02-23 ENCOUNTER — Other Ambulatory Visit: Payer: Self-pay

## 2018-02-23 ENCOUNTER — Encounter (HOSPITAL_COMMUNITY): Payer: Self-pay

## 2018-02-23 DIAGNOSIS — O1495 Unspecified pre-eclampsia, complicating the puerperium: Principal | ICD-10-CM | POA: Diagnosis present

## 2018-02-23 DIAGNOSIS — Z87891 Personal history of nicotine dependence: Secondary | ICD-10-CM

## 2018-02-23 DIAGNOSIS — R51 Headache: Secondary | ICD-10-CM | POA: Diagnosis present

## 2018-02-23 HISTORY — DX: Essential (primary) hypertension: I10

## 2018-02-23 LAB — URINALYSIS, ROUTINE W REFLEX MICROSCOPIC
Bacteria, UA: NONE SEEN
Bilirubin Urine: NEGATIVE
Glucose, UA: NEGATIVE mg/dL
Ketones, ur: NEGATIVE mg/dL
Nitrite: NEGATIVE
Protein, ur: NEGATIVE mg/dL
Specific Gravity, Urine: 1.016 (ref 1.005–1.030)
pH: 6 (ref 5.0–8.0)

## 2018-02-23 LAB — CBC
HCT: 24.1 % — ABNORMAL LOW (ref 36.0–46.0)
Hemoglobin: 7.5 g/dL — ABNORMAL LOW (ref 12.0–15.0)
MCH: 27.6 pg (ref 26.0–34.0)
MCHC: 31.1 g/dL (ref 30.0–36.0)
MCV: 88.6 fL (ref 78.0–100.0)
Platelets: 291 10*3/uL (ref 150–400)
RBC: 2.72 MIL/uL — ABNORMAL LOW (ref 3.87–5.11)
RDW: 15.2 % (ref 11.5–15.5)
WBC: 12.4 10*3/uL — ABNORMAL HIGH (ref 4.0–10.5)

## 2018-02-23 LAB — PROTEIN / CREATININE RATIO, URINE
Creatinine, Urine: 101 mg/dL
Protein Creatinine Ratio: 0.21 mg/mg{Cre} — ABNORMAL HIGH (ref 0.00–0.15)
Total Protein, Urine: 21 mg/dL

## 2018-02-23 LAB — COMPREHENSIVE METABOLIC PANEL
ALT: 28 U/L (ref 14–54)
AST: 29 U/L (ref 15–41)
Albumin: 2.5 g/dL — ABNORMAL LOW (ref 3.5–5.0)
Alkaline Phosphatase: 114 U/L (ref 38–126)
Anion gap: 10 (ref 5–15)
BUN: 23 mg/dL — ABNORMAL HIGH (ref 6–20)
CO2: 20 mmol/L — ABNORMAL LOW (ref 22–32)
Calcium: 8.5 mg/dL — ABNORMAL LOW (ref 8.9–10.3)
Chloride: 111 mmol/L (ref 101–111)
Creatinine, Ser: 0.8 mg/dL (ref 0.44–1.00)
GFR calc Af Amer: >60 mL/min (ref >60)
GFR calc non Af Amer: >60 mL/min (ref >60)
Glucose, Bld: 84 mg/dL (ref 65–99)
Potassium: 3.9 mmol/L (ref 3.5–5.1)
Sodium: 141 mmol/L (ref 135–145)
Total Bilirubin: 0.6 mg/dL (ref 0.3–1.2)
Total Protein: 5.7 g/dL — ABNORMAL LOW (ref 6.5–8.1)

## 2018-02-23 MED ORDER — MAGNESIUM SULFATE 40 G IN LACTATED RINGERS - SIMPLE
2.0000 g/h | INTRAVENOUS | Status: AC
  Administered 2018-02-23 – 2018-02-24 (×2): 2 g/h via INTRAVENOUS
  Filled 2018-02-23 (×2): qty 40

## 2018-02-23 MED ORDER — ACETAMINOPHEN 325 MG PO TABS
650.0000 mg | ORAL_TABLET | Freq: Four times a day (QID) | ORAL | Status: DC | PRN
Start: 2018-02-23 — End: 2018-02-25
  Administered 2018-02-24: 650 mg via ORAL
  Filled 2018-02-23 (×3): qty 2

## 2018-02-23 MED ORDER — LACTATED RINGERS IV SOLN
INTRAVENOUS | Status: DC
  Administered 2018-02-23 – 2018-02-24 (×2): via INTRAVENOUS

## 2018-02-23 MED ORDER — FERROUS SULFATE 325 (65 FE) MG PO TABS
325.0000 mg | ORAL_TABLET | Freq: Three times a day (TID) | ORAL | Status: DC
  Administered 2018-02-24 – 2018-02-25 (×3): 325 mg via ORAL
  Filled 2018-02-23 (×3): qty 1

## 2018-02-23 MED ORDER — MAGNESIUM SULFATE BOLUS VIA INFUSION
4.0000 g | Freq: Once | INTRAVENOUS | Status: AC
  Administered 2018-02-23: 4 g via INTRAVENOUS
  Filled 2018-02-23: qty 500

## 2018-02-23 MED ORDER — ZOLPIDEM TARTRATE 5 MG PO TABS: 5.0000 mg | ORAL_TABLET | Freq: Every evening | ORAL | Status: DC | PRN

## 2018-02-23 MED ORDER — FAMOTIDINE 20 MG PO TABS
20.0000 mg | ORAL_TABLET | Freq: Every day | ORAL | Status: DC
  Administered 2018-02-23 – 2018-02-25 (×3): 20 mg via ORAL
  Filled 2018-02-23 (×3): qty 1

## 2018-02-23 MED ORDER — LACTATED RINGERS IV SOLN
INTRAVENOUS | Status: DC
  Administered 2018-02-23: 22:00:00 via INTRAVENOUS

## 2018-02-23 MED ORDER — LABETALOL HCL 5 MG/ML IV SOLN
20.0000 mg | INTRAVENOUS | Status: DC | PRN
  Administered 2018-02-23: 20 mg via INTRAVENOUS
  Filled 2018-02-23: qty 4

## 2018-02-23 MED ORDER — HYDRALAZINE HCL 20 MG/ML IJ SOLN: 10.0000 mg | Freq: Once | INTRAMUSCULAR | Status: DC | PRN

## 2018-02-23 MED ORDER — COMPLETENATE 29-1 MG PO CHEW
1.0000 | CHEWABLE_TABLET | Freq: Every day | ORAL | Status: DC
  Administered 2018-02-24: 1 via ORAL
  Filled 2018-02-23 (×3): qty 1

## 2018-02-23 NOTE — MAU Provider Note (Signed)
Chief Complaint: Hypertension   First Provider Initiated Contact with Patient 02/23/18 2117      SUBJECTIVE HPI: Lynn Henry is a 22 y.o. G1P1001 PP day 6 who presents to maternity admissions reporting hypertension. Pt reports not feeling well this morning when she woke up and being extremely tired during the day. She reports her Grandmother encouraging to take her BP with her not feeling well and noted to it being elevated. She reports taking her BP 6 times today with each one being elevated- pt states most were over 160/100s with the lowest being 132/100. She reports having a HA earlier this morning around 1000, rated HA 6/10- did not take any medication for headache and it went away on its own. Currently denies having a headache. She denies vision changes or RUQ pain. She reports having increased swelling of her lower extremities since the last week of May. She denies having hypertension issues during pregnancy or labor. She had SVD on 02/17/2018.  Past Medical History:  Diagnosis Date  . Chronic abdominal pain    presumed IBS  . Hypertension    gestational HTN towards end of pregnancy  . IBS (irritable bowel syndrome)    Past Surgical History:  Procedure Laterality Date  . TOOTH EXTRACTION     Social History   Socioeconomic History  . Marital status: Married    Spouse name: Not on file  . Number of children: Not on file  . Years of education: Not on file  . Highest education level: Not on file  Occupational History  . Not on file  Social Needs  . Financial resource strain: Not on file  . Food insecurity:    Worry: Not on file    Inability: Not on file  . Transportation needs:    Medical: Not on file    Non-medical: Not on file  Tobacco Use  . Smoking status: Former Smoker    Packs/day: 0.50    Types: Cigarettes    Last attempt to quit: 06/19/2017    Years since quitting: 0.6  . Smokeless tobacco: Never Used  . Tobacco comment: quit when pregnanct  Substance and  Sexual Activity  . Alcohol use: No    Alcohol/week: 0.0 oz  . Drug use: No  . Sexual activity: Yes    Birth control/protection: None  Lifestyle  . Physical activity:    Days per week: Not on file    Minutes per session: Not on file  . Stress: Not on file  Relationships  . Social connections:    Talks on phone: Not on file    Gets together: Not on file    Attends religious service: Not on file    Active member of club or organization: Not on file    Attends meetings of clubs or organizations: Not on file    Relationship status: Not on file  . Intimate partner violence:    Fear of current or ex partner: Not on file    Emotionally abused: Not on file    Physically abused: Not on file    Forced sexual activity: Not on file  Other Topics Concern  . Not on file  Social History Narrative  . Not on file   No current facility-administered medications on file prior to encounter.    Current Outpatient Medications on File Prior to Encounter  Medication Sig Dispense Refill  . oxyCODONE 10 MG TABS Take 1 tablet (10 mg total) by mouth every 4 (four) hours as needed (  pain scale > 7). 10 tablet 0   No Known Allergies  ROS:  Review of Systems  Constitutional:       Hypertension  Respiratory: Negative.   Cardiovascular: Negative.   Gastrointestinal: Negative.   Neurological: Negative.    I have reviewed patient's Past Medical Hx, Surgical Hx, Family Hx, Social Hx, medications and allergies.   Physical Exam   Patient Vitals for the past 24 hrs:  BP Temp Temp src Pulse Resp SpO2  02/23/18 2146 (!) 172/105 - - 89 - -  02/23/18 2131 (!) 169/100 - - 83 - -  02/23/18 2118 (!) 177/105 98.4 F (36.9 C) Oral 83 18 100 %  02/23/18 2109 - - - - - 99 %   Constitutional: Well-developed, well-nourished female in no acute distress.  Cardiovascular: normal rate Respiratory: normal effort, no diminished breath sounds, wheezing or rales heard  GI: Abd soft, non-tender. Pos BS x 4 MS:  Extremities nontender, +2 pedal and ankle edema on bilateral extremities, normal ROM, +2 DTR, no clonus  Neurologic: Alert and oriented x 4.   LAB RESULTS Results for orders placed or performed during the hospital encounter of 02/23/18 (from the past 24 hour(s))  Urinalysis, Routine w reflex microscopic     Status: Abnormal   Collection Time: 02/23/18  9:05 PM  Result Value Ref Range   Color, Urine YELLOW YELLOW   APPearance HAZY (A) CLEAR   Specific Gravity, Urine 1.016 1.005 - 1.030   pH 6.0 5.0 - 8.0   Glucose, UA NEGATIVE NEGATIVE mg/dL   Hgb urine dipstick LARGE (A) NEGATIVE   Bilirubin Urine NEGATIVE NEGATIVE   Ketones, ur NEGATIVE NEGATIVE mg/dL   Protein, ur NEGATIVE NEGATIVE mg/dL   Nitrite NEGATIVE NEGATIVE   Leukocytes, UA SMALL (A) NEGATIVE   RBC / HPF 21-50 0 - 5 RBC/hpf   WBC, UA 21-50 0 - 5 WBC/hpf   Bacteria, UA NONE SEEN NONE SEEN   Mucus PRESENT   Protein / creatinine ratio, urine     Status: Abnormal   Collection Time: 02/23/18  9:05 PM  Result Value Ref Range   Creatinine, Urine 101.00 mg/dL   Total Protein, Urine 21 mg/dL   Protein Creatinine Ratio 0.21 (H) 0.00 - 0.15 mg/mg[Cre]  CBC     Status: Abnormal   Collection Time: 02/23/18  9:16 PM  Result Value Ref Range   WBC 12.4 (H) 4.0 - 10.5 K/uL   RBC 2.72 (L) 3.87 - 5.11 MIL/uL   Hemoglobin 7.5 (L) 12.0 - 15.0 g/dL   HCT 16.124.1 (L) 09.636.0 - 04.546.0 %   MCV 88.6 78.0 - 100.0 fL   MCH 27.6 26.0 - 34.0 pg   MCHC 31.1 30.0 - 36.0 g/dL   RDW 40.915.2 81.111.5 - 91.415.5 %   Platelets 291 150 - 400 K/uL  Comprehensive metabolic panel     Status: Abnormal   Collection Time: 02/23/18  9:16 PM  Result Value Ref Range   Sodium 141 135 - 145 mmol/L   Potassium 3.9 3.5 - 5.1 mmol/L   Chloride 111 101 - 111 mmol/L   CO2 20 (L) 22 - 32 mmol/L   Glucose, Bld 84 65 - 99 mg/dL   BUN 23 (H) 6 - 20 mg/dL   Creatinine, Ser 7.820.80 0.44 - 1.00 mg/dL   Calcium 8.5 (L) 8.9 - 10.3 mg/dL   Total Protein 5.7 (L) 6.5 - 8.1 g/dL   Albumin  2.5 (L) 3.5 - 5.0 g/dL   AST 29  15 - 41 U/L   ALT 28 14 - 54 U/L   Alkaline Phosphatase 114 38 - 126 U/L   Total Bilirubin 0.6 0.3 - 1.2 mg/dL   GFR calc non Af Amer >60 >60 mL/min   GFR calc Af Amer >60 >60 mL/min   Anion gap 10 5 - 15    --/--/O POS, O POS Performed at [facility], [address], [place], [place] [address]  (05/26 1410)  MAU Management/MDM: Orders Placed This Encounter  Procedures  . Urinalysis, Routine w reflex microscopic  . Protein / creatinine ratio, urine  . CBC  . Comprehensive metabolic panel  . Check blood pressure 20 minutes after giving hydrALAZINE 10 mg IV dose. Call MD if SBP >/= 160 and/or DBP >/= 110.  . Once BP goal is reached, repeat BP every 10 minutes for 1 hour, then every 15 minutes for 1 hours, then per policy for antepartum labor or post-partum.  . Insert peripheral IV   UA- negative  PreE labs- WNL other than elevation of PCR to 0.21  Meds ordered this encounter  Medications  . lactated ringers infusion  . labetalol (NORMODYNE,TRANDATE) injection 20-80 mg  . hydrALAZINE (APRESOLINE) injection 10 mg   Severe range BP on arrival to MAU- preeclampsia protocol initiated. Labetalol 20mg  given to patient IV.   Consult Dr Henderson Cloud with severe BP and notification of treatment with labetalol. Recommends admission to OB high risk with Magnesium.   Admit to HROB.   ASSESSMENT 1. Preeclampsia, postpartum  PLAN Admit to HROB  Orders placed  Care taken over by Dr Henderson Cloud  Magnesium 4g bolus and 2g load initiated    Steward Drone  Certified Nurse-Midwife 02/23/2018  9:55 PM

## 2018-02-23 NOTE — MAU Note (Addendum)
PP pt here for HTN. Been monitoring BP at home and several high. Swelling has not changed on her legs.   Denies ha, rib pain, reflex 2+  2145: 1st dose labetalol given  Pt to 3rd via bed.

## 2018-02-24 ENCOUNTER — Other Ambulatory Visit: Payer: Self-pay

## 2018-02-24 MED ORDER — ACETAMINOPHEN 500 MG PO TABS
1000.0000 mg | ORAL_TABLET | Freq: Once | ORAL | Status: AC
  Administered 2018-02-24: 1000 mg via ORAL
  Filled 2018-02-24: qty 2

## 2018-02-24 MED ORDER — OXYCODONE-ACETAMINOPHEN 5-325 MG PO TABS
1.0000 | ORAL_TABLET | ORAL | Status: DC | PRN
  Administered 2018-02-24 (×2): 1 via ORAL
  Filled 2018-02-24 (×2): qty 1

## 2018-02-24 MED ORDER — ONDANSETRON HCL 4 MG/2ML IJ SOLN
4.0000 mg | Freq: Once | INTRAMUSCULAR | Status: AC
  Administered 2018-02-24: 4 mg via INTRAVENOUS
  Filled 2018-02-24: qty 2

## 2018-02-24 MED ORDER — NIFEDIPINE ER OSMOTIC RELEASE 30 MG PO TB24
30.0000 mg | ORAL_TABLET | Freq: Once | ORAL | Status: AC
  Administered 2018-02-24: 30 mg via ORAL
  Filled 2018-02-24: qty 1

## 2018-02-24 MED ORDER — PROMETHAZINE HCL 25 MG/ML IJ SOLN
12.5000 mg | Freq: Three times a day (TID) | INTRAMUSCULAR | Status: DC | PRN
Start: 2018-02-24 — End: 2018-02-25
  Administered 2018-02-24: 12.5 mg via INTRAVENOUS
  Filled 2018-02-24: qty 1

## 2018-02-24 MED ORDER — NIFEDIPINE ER OSMOTIC RELEASE 30 MG PO TB24: 30.0000 mg | ORAL_TABLET | Freq: Every day | ORAL | Status: DC

## 2018-02-24 NOTE — H&P (Signed)
22 y.o. 6664w1d  G1P1001 PP SVD Day 7 comes in c/o increased blood pressures at home.  Had HA yesterday and continues to have HA this am.  No other sx of preeclampsia.   Yesterday per NP: "HPI: Arley PhenixSavannah A Stamper is a 22 y.o. G1P1001 PP day 6 who presents to maternity admissions reporting hypertension. Pt reports not feeling well this morning when she woke up and being extremely tired during the day. She reports her Grandmother encouraging to take her BP with her not feeling well and noted to it being elevated. She reports taking her BP 6 times today with each one being elevated- pt states most were over 160/100s with the lowest being 132/100. She reports having a HA earlier this morning around 1000, rated HA 6/10- did not take any medication for headache and it went away on its own. Currently denies having a headache. She denies vision changes or RUQ pain. She reports having increased swelling of her lower extremities since the last week of May. She denies having hypertension issues during pregnancy or labor. She had SVD on 02/17/2018."  Pt had several severe BPs 160s-170s/100-105 in [facility]U.  She was given both labetalol and hydralazine per protocol for the hypertension.  Labs showed no evidence of HELLP.  Pt was admitted and started on MgSO4.  O/N pt had HA again and was given tylenol.  This am she was given percocet for the continued HA.   Past Medical History:  Diagnosis Date  . Chronic abdominal pain    presumed IBS  . Hypertension    gestational HTN towards end of pregnancy  . IBS (irritable bowel syndrome)     Past Surgical History:  Procedure Laterality Date  . TOOTH EXTRACTION      OB History  Gravida Para Term Preterm AB Living  1 1 1     1   SAB TAB Ectopic Multiple Live Births        0 1    # Outcome Date GA Lbr Len/2nd Weight Sex Delivery Anes PTL Lv  1 Term 02/17/18 6164w1d 22:52 / 01:12 8 lb 14.5 oz (4.04 kg) M Vag-Spont EPI  LIV    Social History   Socioeconomic History  .  Marital status: Married    Spouse name: Not on file  . Number of children: Not on file  . Years of education: Not on file  . Highest education level: Not on file  Occupational History  . Not on file  Social Needs  . Financial resource strain: Not on file  . Food insecurity:    Worry: Not on file    Inability: Not on file  . Transportation needs:    Medical: Not on file    Non-medical: Not on file  Tobacco Use  . Smoking status: Former Smoker    Packs/day: 0.50    Types: Cigarettes    Last attempt to quit: 06/19/2017    Years since quitting: 0.6  . Smokeless tobacco: Never Used  . Tobacco comment: quit when pregnanct  Substance and Sexual Activity  . Alcohol use: No    Alcohol/week: 0.0 oz  . Drug use: No  . Sexual activity: Yes    Birth control/protection: None  Lifestyle  . Physical activity:    Days per week: Not on file    Minutes per session: Not on file  . Stress: Not on file  Relationships  . Social connections:    Talks on phone: Not on file    Gets  together: Not on file    Attends religious service: Not on file    Active member of club or organization: Not on file    Attends meetings of clubs or organizations: Not on file    Relationship status: Not on file  . Intimate partner violence:    Fear of current or ex partner: Not on file    Emotionally abused: Not on file    Physically abused: Not on file    Forced sexual activity: Not on file  Other Topics Concern  . Not on file  Social History Narrative  . Not on file   Patient has no known allergies.       Vitals:   02/24/18 0329 02/24/18 0428 02/24/18 0530 02/24/18 0628  BP: 131/88 126/85 124/86 134/88  Pulse: (!) 101 98 96 98  Resp: 18 18 18 18   Temp:      TempSrc:      SpO2: 98% 99% 99% 98%    Lungs/Cor:  NAD Abdomen:  soft, gravid Ex:  no cords, erythema   Results for orders placed or performed during the hospital encounter of 02/23/18 (from the past 24 hour(s))  Urinalysis, Routine w  reflex microscopic     Status: Abnormal   Collection Time: 02/23/18  9:05 PM  Result Value Ref Range   Color, Urine YELLOW YELLOW   APPearance HAZY (A) CLEAR   Specific Gravity, Urine 1.016 1.005 - 1.030   pH 6.0 5.0 - 8.0   Glucose, UA NEGATIVE NEGATIVE mg/dL   Hgb urine dipstick LARGE (A) NEGATIVE   Bilirubin Urine NEGATIVE NEGATIVE   Ketones, ur NEGATIVE NEGATIVE mg/dL   Protein, ur NEGATIVE NEGATIVE mg/dL   Nitrite NEGATIVE NEGATIVE   Leukocytes, UA SMALL (A) NEGATIVE   RBC / HPF 21-50 0 - 5 RBC/hpf   WBC, UA 21-50 0 - 5 WBC/hpf   Bacteria, UA NONE SEEN NONE SEEN   Mucus PRESENT   Protein / creatinine ratio, urine     Status: Abnormal   Collection Time: 02/23/18  9:05 PM  Result Value Ref Range   Creatinine, Urine 101.00 mg/dL   Total Protein, Urine 21 mg/dL   Protein Creatinine Ratio 0.21 (H) 0.00 - 0.15 mg/mg[Cre]  CBC     Status: Abnormal   Collection Time: 02/23/18  9:16 PM  Result Value Ref Range   WBC 12.4 (H) 4.0 - 10.5 K/uL   RBC 2.72 (L) 3.87 - 5.11 MIL/uL   Hemoglobin 7.5 (L) 12.0 - 15.0 g/dL   HCT 16.1 (L) 09.6 - 04.5 %   MCV 88.6 78.0 - 100.0 fL   MCH 27.6 26.0 - 34.0 pg   MCHC 31.1 30.0 - 36.0 g/dL   RDW 40.9 81.1 - 91.4 %   Platelets 291 150 - 400 K/uL  Comprehensive metabolic panel     Status: Abnormal   Collection Time: 02/23/18  9:16 PM  Result Value Ref Range   Sodium 141 135 - 145 mmol/L   Potassium 3.9 3.5 - 5.1 mmol/L   Chloride 111 101 - 111 mmol/L   CO2 20 (L) 22 - 32 mmol/L   Glucose, Bld 84 65 - 99 mg/dL   BUN 23 (H) 6 - 20 mg/dL   Creatinine, Ser 7.82 0.44 - 1.00 mg/dL   Calcium 8.5 (L) 8.9 - 10.3 mg/dL   Total Protein 5.7 (L) 6.5 - 8.1 g/dL   Albumin 2.5 (L) 3.5 - 5.0 g/dL   AST 29 15 - 41 U/L  ALT 28 14 - 54 U/L   Alkaline Phosphatase 114 38 - 126 U/L   Total Bilirubin 0.6 0.3 - 1.2 mg/dL   GFR calc non Af Amer >60 >60 mL/min   GFR calc Af Amer >60 >60 mL/min   Anion gap 10 5 - 15    A/P   PP Day  Preeclampsia superimposed on  GHTN.  Magnesium Sulfate for sz prophylaxis for 24 hours.  Pt not needing BP meds right now but may need when off magnesium.   Lanisa Ishler A

## 2018-02-24 NOTE — Progress Notes (Signed)
Pt reports mild headache improved with tylenol.  Had episode of emesis earlier, that she thinks may have caused N/V.  She also reports she had some chest pain earlier but it resolved when she sat up.  Currently no CP/SOB.  No calf tenderness.  Minimal bleeding.  Was started on MgSO4 last night and received IV BP med but was not started on daily BP control.  With severe range diastolics I advised RN to start porcardia 30mg  XL.  Urine output is excellent.  She denies other problems.  Labs yesterday normal.  Will maintain MgSO4 x 24hrs.

## 2018-02-25 MED ORDER — NIFEDIPINE ER OSMOTIC RELEASE 60 MG PO TB24: 60.0000 mg | ORAL_TABLET | Freq: Every day | ORAL | 0 refills | Status: DC

## 2018-02-25 MED ORDER — NIFEDIPINE ER OSMOTIC RELEASE 30 MG PO TB24
60.0000 mg | ORAL_TABLET | Freq: Every day | ORAL | Status: DC
  Administered 2018-02-25: 60 mg via ORAL
  Filled 2018-02-25: qty 2

## 2018-02-25 NOTE — Plan of Care (Signed)
  Problem: Education: Goal: Knowledge of General Education information will improve Outcome: Progressing   Problem: Health Behavior/Discharge Planning: Goal: Ability to manage health-related needs will improve Outcome: Progressing   Problem: Clinical Measurements: Goal: Ability to maintain clinical measurements within normal limits will improve Outcome: Progressing Goal: Will remain free from infection Outcome: Progressing Goal: Diagnostic test results will improve Outcome: Progressing Goal: Respiratory complications will improve Outcome: Progressing Goal: Cardiovascular complication will be avoided Outcome: Progressing   Problem: Coping: Goal: Level of anxiety will decrease Outcome: Progressing   Problem: Pain Managment: Goal: General experience of comfort will improve Outcome: Progressing   Problem: Safety: Goal: Ability to remain free from injury will improve Outcome: Progressing   Problem: Skin Integrity: Goal: Risk for impaired skin integrity will decrease Outcome: Progressing   Problem: Fluid Volume: Goal: Peripheral tissue perfusion will improve Outcome: Progressing   Problem: Clinical Measurements: Goal: Complications related to disease process, condition or treatment will be avoided or minimized Outcome: Progressing

## 2018-02-25 NOTE — Progress Notes (Signed)
Pt has no complaints. Now off mag. Voiding well. Was started on procardia 30mg  XL. B/Ps improved but still elevated.  Plan/ Will discharge pt to home today after increasing dose to 60 mg.             Will follow up in 2-3 days in office

## 2018-02-25 NOTE — Progress Notes (Signed)
Patient ID: Lynn Henry, female   DOB: 02-28-1996, 22 y.o.   MRN: [id number] Pt teaching complete     questions   answered

## 2018-02-28 NOTE — Discharge Summary (Signed)
Pt was readmitted after a delivery for elevated B/Ps. She had nl PIH labs. She was given HTN meds to control her B/P. She received MgSO4 for 24hr. She had no HA/RUQ pain. She was discharged with Procardia XL60mg /day and instructed to follow up in the office

## 2018-05-08 ENCOUNTER — Ambulatory Visit
Admission: RE | Admit: 2018-05-08 | Discharge: 2018-05-08 | Disposition: A | Discharge status: Home / Self Care | Payer: BLUE CROSS/BLUE SHIELD | Source: Ambulatory Visit | Attending: Cardiology | Admitting: Cardiology

## 2018-05-08 ENCOUNTER — Other Ambulatory Visit: Payer: Self-pay | Admitting: Cardiology

## 2018-05-08 DIAGNOSIS — M79662 Pain in left lower leg: Secondary | ICD-10-CM | POA: Diagnosis present

## 2018-07-16 ENCOUNTER — Telehealth: Payer: Self-pay | Admitting: Family Medicine

## 2018-07-16 NOTE — Telephone Encounter (Unsigned)
Copied from CRM [id number]. Topic: Quick Communication - See Telephone Encounter >> Jul 16, 2018  3:01 PM Floria Raveling A wrote: CRM for notification. See Telephone encounter for: 07/16/18.  Pt called in stated that her GYN tested her TSH and told her it was abnormal and the results would be sent to her PCP.  She would like to know those results and next the step?     Best number -[phone]

## 2018-07-17 NOTE — Telephone Encounter (Signed)
I have not seen anything faxed over as of yet for this patient's lab work.

## 2018-07-18 ENCOUNTER — Telehealth: Payer: Self-pay | Admitting: Family Medicine

## 2018-07-18 ENCOUNTER — Encounter: Payer: Self-pay | Admitting: Family Medicine

## 2018-07-18 ENCOUNTER — Ambulatory Visit (INDEPENDENT_AMBULATORY_CARE_PROVIDER_SITE_OTHER): Payer: BLUE CROSS/BLUE SHIELD | Admitting: Family Medicine

## 2018-07-18 VITALS — BP 98/58 | HR 123 | Temp 98.6°F | Ht 65.0 in | Wt 130.6 lb

## 2018-07-18 DIAGNOSIS — E0590 Thyrotoxicosis, unspecified without thyrotoxic crisis or storm: Secondary | ICD-10-CM

## 2018-07-18 DIAGNOSIS — R000 Tachycardia, unspecified: Secondary | ICD-10-CM | POA: Diagnosis not present

## 2018-07-18 NOTE — Telephone Encounter (Signed)
Can you call patient to see if she can come back for lab draw - we walked by the lab on her way to check out. If she cannot come back today, she can come on monday

## 2018-07-18 NOTE — Addendum Note (Signed)
Addended by: Penne Lash on: 07/18/2018 03:25 PM   Modules accepted: Orders

## 2018-07-18 NOTE — Patient Instructions (Signed)
Hyperthyroidism Hyperthyroidism is when the thyroid is too active (overactive). Your thyroid is a large gland that is located in your neck. The thyroid helps to control how your body uses food (metabolism). When your thyroid is overactive, it produces too much of a hormone called thyroxine. What are the causes? Causes of hyperthyroidism may include:  Graves disease. This is when your immune system attacks the thyroid gland. This is the most common cause.  Inflammation of the thyroid gland.  Tumor in the thyroid gland or somewhere else.  Excessive use of thyroid medicines, including: ? Prescription thyroid supplement. ? Herbal supplements that mimic thyroid hormones.  Solid or fluid-filled lumps within your thyroid gland (thyroid nodules).  Excessive ingestion of iodine.  What increases the risk?  Being female.  Having a family history of thyroid conditions. What are the signs or symptoms? Signs and symptoms of hyperthyroidism may include:  Nervousness.  Inability to tolerate heat.  Unexplained weight loss.  Diarrhea.  Change in the texture of hair or skin.  Heart skipping beats or making extra beats.  Rapid heart rate.  Loss of menstruation.  Shaky hands.  Fatigue.  Restlessness.  Increased appetite.  Sleep problems.  Enlarged thyroid gland or nodules.  How is this diagnosed? Diagnosis of hyperthyroidism may include:  Medical history and physical exam.  Blood tests.  Ultrasound tests.  How is this treated? Treatment may include:  Medicines to control your thyroid.  Surgery to remove your thyroid.  Radiation therapy.  Follow these instructions at home:  Take medicines only as directed by your health care provider.  Do not use any tobacco products, including cigarettes, chewing tobacco, or electronic cigarettes. If you need help quitting, ask your health care provider.  Do not exercise or do physical activity until your health care provider  approves.  Keep all follow-up appointments as directed by your health care provider. This is important. Contact a health care provider if:  Your symptoms do not get better with treatment.  You have fever.  You are taking thyroid replacement medicine and you: ? Have depression. ? Feel mentally and physically slow. ? Have weight gain. Get help right away if:  You have decreased alertness or a change in your awareness.  You have abdominal pain.  You feel dizzy.  You have a rapid heartbeat.  You have an irregular heartbeat. This information is not intended to replace advice given to you by your health care provider. Make sure you discuss any questions you have with your health care provider. Document Released: 09/10/2005 Document Revised: 02/09/2016 Document Reviewed: 01/26/2014 Elsevier Interactive Patient Education  2018 Elsevier Inc.  

## 2018-07-18 NOTE — Telephone Encounter (Signed)
Were you able to get results?

## 2018-07-18 NOTE — Telephone Encounter (Signed)
Lynn Henry called and got them over the phone because we did not see on the fax, but Lynn Henry will need a copy of them to go with her endocrine referral

## 2018-07-18 NOTE — Telephone Encounter (Signed)
Do we have her thyroid results - I dont see in epic so maybe her GYN is not on epic?  Her appt is today at 50 AM

## 2018-07-18 NOTE — Telephone Encounter (Signed)
Called Pt back and she said she will come back today to get her blood drawn.

## 2018-07-18 NOTE — Progress Notes (Signed)
Subjective:    Patient ID: Lynn Henry, female    DOB: 26-Jul-1996, 22 y.o.   MRN: [id number]  HPI   Patient presents to clinic due to abnormal thyroid levels discovered by her GYN.  Patient had a baby 5 months ago, had been feeling like she was having hot flashes off and on, hair falling out ever since having her son.  Patient at first thought this was all related to her hormones readjusting, but her GYN suggested they check her thyroid levels and it was shown to have a TSH of 0.007 and a free T4 of greater than 7.77.  Patient was advised by her GYN to follow-up with PCP for referral to endocrinology.   Patient Active Problem List   Diagnosis Date Noted  . Chronic tachycardia 07/18/2018  . Hyperthyroidism 07/18/2018  . Preeclampsia in postpartum period 02/23/2018  . Spontaneous vaginal delivery 02/17/2018  . Pregnancy 02/16/2018  . Viral illness 01/14/2017  . Pharyngitis 11/08/2016  . Cellulitis 04/02/2016  . Preventative health care 10/26/2015  . Seasonal allergies 10/26/2015  . Abdominal pain, chronic, epigastric 10/26/2015   Social History   Tobacco Use  . Smoking status: Former Smoker    Packs/day: 0.50    Types: Cigarettes    Last attempt to quit: 06/19/2017    Years since quitting: 1.0  . Smokeless tobacco: Never Used  . Tobacco comment: quit when pregnanct  Substance Use Topics  . Alcohol use: No    Alcohol/week: 0.0 standard drinks   Review of Systems  Constitutional: Hot flashes off and on HENT: Negative for congestion, ear pain, sinus pain and sore throat.   Eyes: Negative.   Respiratory: Negative for cough, shortness of breath and wheezing.   Cardiovascular: Negative for chest pain, palpitations and leg swelling.  Gastrointestinal: Negative for abdominal pain, diarrhea, nausea and vomiting.  Genitourinary: Negative for dysuria, frequency and urgency.  Musculoskeletal: Negative for arthralgias and myalgias.  Skin: Negative for color change, pallor and  rash. +hair loss Neurological: Negative for syncope, light-headedness and headaches.  Psychiatric/Behavioral: The patient is not nervous/anxious.    Objective:   Physical Exam  Constitutional: She appears well-developed and well-nourished. No distress.  Head: Normocephalic and atraumatic.  Eyes: Pupils are equal, round, and reactive to light. EOM are normal. No scleral icterus.  Neck: Normal range of motion. Neck supple. No tracheal deviation present.  Cardiovascular: +tachycardia. S1 S2 sounds rhythmic Pulmonary/Chest: Effort normal and breath sounds normal. No respiratory distress. She has no wheezes. She has no rales.  Abdominal: Soft. Bowel sounds are normal. There is no tenderness.  Neurological: She is alert and oriented to person, place, and time.  Gait normal  Skin: Skin is warm and dry. No pallor.  Psychiatric: She has a normal mood and affect. Her behavior is normal. Thought content normal.   Nursing note and vitals reviewed.   Vitals:   07/18/18 1100 07/18/18 1125  BP: (!) 98/58   Pulse: (!) 148 (!) 123  Temp: 98.6 F (37 C)   SpO2: 98%       Assessment & Plan:    Hyperthyroidism - patient's lab work done at [facility] showed a TSH of 0.007 and a T4 of greater than 7.77.  Patient's aunt has a history of hyperthyroidism and has suggested Dr. Lynnea Ferrier as a good endocrinologist to see.  We will do urgent endocrinology referral for evaluation.  We will plan to get repeat thyroid panel to include T3 levels as well.  Tachycardia -  patient states she has had elevated heart rate for years, has seen multiple doctors for this.  Patient states she is not concerned about her HR today and states her resting heart rate usually ranges between 110-120.  I advised patient that if she begins to have feelings of palpitations, chest pain, short of breath, feeling faint and dizzy to go to the emergency room right away.  Offered to trial a low-dose metoprolol to see if this helps  bring down heart rate, patient declined states she had been offered this in the past as well but would rather not take the medication.  Patient will see endocrinology as soon as possible.

## 2018-07-18 NOTE — Telephone Encounter (Signed)
Ok thank you 

## 2018-07-19 LAB — THYROID PANEL WITH TSH
Free Thyroxine Index: 16.9 — ABNORMAL HIGH (ref 1.4–3.8)
T3 UPTAKE: 43 % — AB (ref 22–35)
T4 TOTAL: 39.3 ug/dL — AB (ref 5.1–11.9)
TSH: <0.01 mIU/L — ABNORMAL LOW

## 2018-07-22 NOTE — Telephone Encounter (Signed)
Did you ever receive these labs?   Sent to [facility]

## 2018-07-23 NOTE — Telephone Encounter (Signed)
Lauren found them in her chart. I'll close this note Thanks! Melissa

## 2018-08-11 ENCOUNTER — Encounter: Payer: Self-pay | Admitting: *Deleted

## 2018-08-11 ENCOUNTER — Emergency Department
Admission: EM | Admit: 2018-08-11 | Discharge: 2018-08-11 | Disposition: A | Discharge status: Home / Self Care | Payer: BLUE CROSS/BLUE SHIELD | Attending: Emergency Medicine | Admitting: Emergency Medicine

## 2018-08-11 ENCOUNTER — Encounter: Payer: Self-pay | Admitting: Family Medicine

## 2018-08-11 ENCOUNTER — Ambulatory Visit (INDEPENDENT_AMBULATORY_CARE_PROVIDER_SITE_OTHER): Payer: BLUE CROSS/BLUE SHIELD | Admitting: Family Medicine

## 2018-08-11 ENCOUNTER — Other Ambulatory Visit: Payer: Self-pay

## 2018-08-11 ENCOUNTER — Emergency Department: Payer: BLUE CROSS/BLUE SHIELD

## 2018-08-11 VITALS — BP 120/80 | HR 179 | Temp 98.4°F | Ht 65.0 in | Wt 128.0 lb

## 2018-08-11 DIAGNOSIS — R000 Tachycardia, unspecified: Secondary | ICD-10-CM | POA: Insufficient documentation

## 2018-08-11 DIAGNOSIS — R112 Nausea with vomiting, unspecified: Secondary | ICD-10-CM | POA: Diagnosis not present

## 2018-08-11 DIAGNOSIS — Z87891 Personal history of nicotine dependence: Secondary | ICD-10-CM | POA: Diagnosis not present

## 2018-08-11 DIAGNOSIS — Z79899 Other long term (current) drug therapy: Secondary | ICD-10-CM | POA: Diagnosis not present

## 2018-08-11 DIAGNOSIS — R51 Headache: Secondary | ICD-10-CM | POA: Diagnosis not present

## 2018-08-11 DIAGNOSIS — E0590 Thyrotoxicosis, unspecified without thyrotoxic crisis or storm: Secondary | ICD-10-CM

## 2018-08-11 DIAGNOSIS — R519 Headache, unspecified: Secondary | ICD-10-CM

## 2018-08-11 DIAGNOSIS — I10 Essential (primary) hypertension: Secondary | ICD-10-CM | POA: Diagnosis not present

## 2018-08-11 LAB — BASIC METABOLIC PANEL
ANION GAP: 9 (ref 5–15)
BUN: 14 mg/dL (ref 6–20)
CALCIUM: 9.7 mg/dL (ref 8.9–10.3)
CO2: 24 mmol/L (ref 22–32)
CREATININE: 0.39 mg/dL — AB (ref 0.44–1.00)
Chloride: 106 mmol/L (ref 98–111)
GFR calc non Af Amer: >60 mL/min (ref >60)
Glucose, Bld: 110 mg/dL — ABNORMAL HIGH (ref 70–99)
Potassium: 4 mmol/L (ref 3.5–5.1)
SODIUM: 139 mmol/L (ref 135–145)

## 2018-08-11 LAB — CBC
HCT: 44.6 % (ref 36.0–46.0)
Hemoglobin: 15.1 g/dL — ABNORMAL HIGH (ref 12.0–15.0)
MCH: 27.7 pg (ref 26.0–34.0)
MCHC: 33.9 g/dL (ref 30.0–36.0)
MCV: 81.7 fL (ref 80.0–100.0)
NRBC: 0 % (ref 0.0–0.2)
PLATELETS: 291 10*3/uL (ref 150–400)
RBC: 5.46 MIL/uL — AB (ref 3.87–5.11)
RDW: 12.7 % (ref 11.5–15.5)
WBC: 4.2 10*3/uL (ref 4.0–10.5)

## 2018-08-11 LAB — TROPONIN I

## 2018-08-11 LAB — POC URINE PREG, ED: Preg Test, Ur: NEGATIVE

## 2018-08-11 MED ORDER — PROPRANOLOL HCL 10 MG PO TABS: 10.0000 mg | ORAL_TABLET | Freq: Four times a day (QID) | ORAL | 0 refills | Status: DC

## 2018-08-11 MED ORDER — SODIUM CHLORIDE 0.9 % IV BOLUS
1000.0000 mL | Freq: Once | INTRAVENOUS | Status: AC
  Administered 2018-08-11: 1000 mL via INTRAVENOUS

## 2018-08-11 MED ORDER — ATENOLOL 25 MG PO TABS
25.0000 mg | ORAL_TABLET | Freq: Once | ORAL | Status: AC
  Administered 2018-08-11: 25 mg via ORAL
  Filled 2018-08-11: qty 1

## 2018-08-11 MED ORDER — METOPROLOL TARTRATE 5 MG/5ML IV SOLN
5.0000 mg | Freq: Once | INTRAVENOUS | Status: AC
  Administered 2018-08-11: 5 mg via INTRAVENOUS
  Filled 2018-08-11: qty 5

## 2018-08-11 MED ORDER — PROPRANOLOL HCL 1 MG/ML IV SOLN
1.0000 mg | Freq: Once | INTRAVENOUS | Status: AC
  Administered 2018-08-11: 1 mg via INTRAVENOUS
  Filled 2018-08-11: qty 1

## 2018-08-11 MED ORDER — PROCHLORPERAZINE EDISYLATE 10 MG/2ML IJ SOLN
10.0000 mg | Freq: Once | INTRAMUSCULAR | Status: AC
  Administered 2018-08-11: 10 mg via INTRAVENOUS
  Filled 2018-08-11: qty 2

## 2018-08-11 NOTE — ED Notes (Signed)
poct pregnancy Negative 

## 2018-08-11 NOTE — Discharge Instructions (Addendum)
Please seek medical attention for any high fevers, chest pain, shortness of breath, change in behavior, persistent vomiting, bloody stool or any other new or concerning symptoms.  

## 2018-08-11 NOTE — ED Notes (Signed)
Called pharmacy and spoke with Lowella BandyNikki about pt's INDERAL medication to be sent. She stated that the medication needs to be verified by Pharmacist and he had to step out for minute and she would let him know once he returned.

## 2018-08-11 NOTE — ED Notes (Signed)
ED Provider at bedside. 

## 2018-08-11 NOTE — ED Triage Notes (Signed)
Pt to triage via wheelchair.  Pt sent from [facility].  Pt with rapid heart rate today and face flushed.  No chest pain or sob.  Pt reports recent h/a's and has appt with thyroid doctor in December.  Pt alert.

## 2018-08-11 NOTE — Progress Notes (Signed)
Subjective:    Patient ID: Lynn Henry, female    DOB: 09/23/1996, 22 y.o.   MRN: [id number]  HPI   Presents to clinic c/o headache for past month that will not improve, causes her to feel nausea and vomit at times.   She also continues to have elevated HR, usually hangs around between 100 -115, but it seems to be worse and her heart feels it is pounding more than usual.  She was also found to be hyperthyroid at last visit 07/18/18, urgent endocrine referral placed but she has not yet seen them - states she is on waiting list to get in to see endocrine at this time for sooner appt, upcoming appt not until end of December 2019.  Patient Active Problem List   Diagnosis Date Noted  . Chronic tachycardia 07/18/2018  . Hyperthyroidism 07/18/2018  . Preeclampsia in postpartum period 02/23/2018  . Spontaneous vaginal delivery 02/17/2018  . Pregnancy 02/16/2018  . Viral illness 01/14/2017  . Pharyngitis 11/08/2016  . Cellulitis 04/02/2016  . Preventative health care 10/26/2015  . Seasonal allergies 10/26/2015  . Abdominal pain, chronic, epigastric 10/26/2015   Social History   Tobacco Use  . Smoking status: Former Smoker    Packs/day: 0.50    Types: Cigarettes    Last attempt to quit: 06/19/2017    Years since quitting: 1.1  . Smokeless tobacco: Never Used  . Tobacco comment: quit when pregnanct  Substance Use Topics  . Alcohol use: No    Alcohol/week: 0.0 standard drinks    Review of Systems  Constitutional: Negative for chills, fatigue and fever.  HENT: Negative for congestion, ear pain, sinus pain and sore throat.   Eyes: Negative.   Respiratory: Negative for cough, shortness of breath and wheezing.   Cardiovascular: +racing heart Gastrointestinal: +nausea, vomiting Genitourinary: Negative for dysuria, frequency and urgency.  Musculoskeletal: Negative for arthralgias and myalgias.  Skin: Negative for color change, pallor and rash.  Neurological: Negative for  syncope, light-headedness. +headache Psychiatric/Behavioral: The patient is not nervous/anxious.    Objective:   Physical Exam  Constitutional: She is oriented to person, place, and time.  HENT:  Head: Normocephalic and atraumatic.  Right Ear: Tympanic membrane, external ear and ear canal normal.  Left Ear: Tympanic membrane, external ear and ear canal normal.  Mouth/Throat: Oropharynx is clear and moist.  Eyes: Pupils are equal, round, and reactive to light. EOM are normal.  Neck: Neck supple. No tracheal deviation present.  Cardiovascular: Tachycardia present.  Pulmonary/Chest: Effort normal and breath sounds normal. No respiratory distress.  Musculoskeletal: She exhibits no edema.  Gait normal  Neurological: She is alert and oriented to person, place, and time.  Skin: No pallor.  Psychiatric: She has a normal mood and affect. Her behavior is normal.   Vitals:   08/11/18 1554  BP: 120/80  Pulse: (!) 179  Temp: 98.4 F (36.9 C)  SpO2: 98%   EKG reviewed by me; shows sinus tach, HR 157    Assessment & Plan:   Chronic tachycardia-patient has had issues with elevated heart rate in the past, usually heart we will be slightly over 100, but today in clinic is quite high at over 150.  Intractable headache with nausea/vomiting -patient has headache for almost a month.  I am fearful of using any medication prescriptions at this time due to her elevated heart rate.  Hyperthyroidism- urgent endocrine referral was placed at last visit, patient is still waiting for a sooner appointment, currently has appointment  end of December 2019.  Patient advised to go directly to ER. Her brother is here in clinic and will drive her there, offered EMS but she declines since her brother is here. She understands need to go right to ER due to her HR, patient given print out of EKG.

## 2018-08-11 NOTE — ED Provider Notes (Signed)
[facility] Emergency Department Provider Note  __________________________________________   I have reviewed the triage vital signs and the nursing notes.   HISTORY  Chief Complaint Tachycardia   History limited by: Not Limited   HPI Lynn Henry is a 22 y.o. female who presents to the emergency department today because of concern for fast heart rate. The patient was sent from primary care doctors office. The patient was diagnosed with hyperthyroidism recently. Had not yet started any medications and is scheduled to follow up with endocrinology next month. States that recently she has been having more frequent headaches. She also has noticed weight loss and heat intolerance.     Per medical record review patient has a history of diagnosis of hyperthyroidism.   Past Medical History:  Diagnosis Date  . Chronic abdominal pain    presumed IBS  . Hypertension    gestational HTN towards end of pregnancy  . IBS (irritable bowel syndrome)     Patient Active Problem List   Diagnosis Date Noted  . Chronic tachycardia 07/18/2018  . Hyperthyroidism 07/18/2018  . Preeclampsia in postpartum period 02/23/2018  . Spontaneous vaginal delivery 02/17/2018  . Pregnancy 02/16/2018  . Viral illness 01/14/2017  . Pharyngitis 11/08/2016  . Cellulitis 04/02/2016  . Preventative health care 10/26/2015  . Seasonal allergies 10/26/2015  . Abdominal pain, chronic, epigastric 10/26/2015    Past Surgical History:  Procedure Laterality Date  . TOOTH EXTRACTION      Prior to Admission medications   Medication Sig Start Date End Date Taking? Authorizing Provider  desloratadine (CLARINEX) 5 MG tablet Take 5 mg by mouth daily. 05/10/18   [provider]  montelukast (SINGULAIR) 10 MG tablet montelukast 10 mg tablet 10/11/16   [provider]  norethindrone-ethinyl estradiol (JUNEL 1/20) 1-20 MG-MCG tablet Take 1 mg by mouth 1 day or 1 dose.    [provider]    Allergies Almond (diagnostic)  Family History  Problem Relation Age of Onset  . Hyperlipidemia Mother   . Hypertension Mother   . Prostate cancer Maternal Grandfather   . Hyperlipidemia Maternal Grandfather   . Heart disease Maternal Grandfather   . Stroke Maternal Grandfather   . Hypertension Maternal Grandfather   . Arthritis Paternal Grandmother   . Prostate cancer Paternal Grandfather   . Diabetes Paternal Grandfather     Social History Social History   Tobacco Use  . Smoking status: Former Smoker    Packs/day: 0.50    Types: Cigarettes    Last attempt to quit: 06/19/2017    Years since quitting: 1.1  . Smokeless tobacco: Never Used  . Tobacco comment: quit when pregnanct  Substance Use Topics  . Alcohol use: No    Alcohol/week: 0.0 standard drinks  . Drug use: No    Review of Systems Constitutional: No fever/chills Eyes: No visual changes. ENT: No sore throat. Cardiovascular: Positive for fast heart rate.  Respiratory: Denies shortness of breath. Gastrointestinal: No abdominal pain.  No nausea, no vomiting.  No diarrhea.   Genitourinary: Negative for dysuria. Musculoskeletal: Negative for back pain. Skin: Negative for rash. Neurological: Positive for headaches.  ____________________________________________   PHYSICAL EXAM:  VITAL SIGNS: ED Triage Vitals  Enc Vitals Group     BP 08/11/18 1710 131/75     Pulse Rate 08/11/18 1710 (!) 170     Resp 08/11/18 1710 20     Temp 08/11/18 1710 98.6 F (37 C)     Temp Source 08/11/18  1710 Oral     SpO2 08/11/18 1710 (!) 0 %     Weight 08/11/18 1712 127 lb (57.6 kg)     Height 08/11/18 1712 5\' 5"  (1.651 m)     Head Circumference --      Peak Flow --      Pain Score 08/11/18 1712 8   Constitutional: Alert and oriented.  Eyes: Conjunctivae are normal.  ENT      Head: Normocephalic and atraumatic.      Nose: No congestion/rhinnorhea.      Mouth/Throat: Mucous membranes are moist.       Neck: No stridor. Hematological/Lymphatic/Immunilogical: No cervical lymphadenopathy. Cardiovascular: Tachycardic, regular rhythm.  No murmurs, rubs, or gallops.  Respiratory: Normal respiratory effort without tachypnea nor retractions. Breath sounds are clear and equal bilaterally. No wheezes/rales/rhonchi. Gastrointestinal: Soft and non tender. No rebound. No guarding.  Genitourinary: Deferred Musculoskeletal: Normal range of motion in all extremities. No lower extremity edema. Neurologic:  Normal speech and language. No gross focal neurologic deficits are appreciated.  Skin:  Skin is warm, dry and intact. No rash noted. Psychiatric: Mood and affect are normal. Speech and behavior are normal. Patient exhibits appropriate insight and judgment.  ____________________________________________    LABS (pertinent positives/negatives)  Trop <0.03 BMP wnl except glu 110, cr 0.39 CBC wbc 4.2, hgb 15.1, plt 291 Upreg negative  ____________________________________________   EKG  I, Phineas SemenGraydon Crista Nuon, attending physician, personally viewed and interpreted this EKG  EKG Time: 1707 Rate: 158 Rhythm: sinus tachycardia Axis: normal Intervals: qtc 395 QRS: narrow ST changes: no st elevation Impression: normal ekg   ____________________________________________    RADIOLOGY  None  ____________________________________________   PROCEDURES  Procedures  ____________________________________________   INITIAL IMPRESSION / ASSESSMENT AND PLAN / ED COURSE  Pertinent labs & imaging results that were available during my care of the patient were reviewed by me and considered in my medical decision making (see chart for details).   Patient presented to the emergency department today from primary care physician's office because of concerns for significant tachycardia in the setting of hyperthyroidism. Patient was given multiple beta-blockers here.  Patient's heart rate did improve.  Still  however was tachycardic.  I did have a discussion with the patient.  She states her heart rate normally is around 115.  She did feel comfortable going home at this point.  I discussed importance of following up with primary care.  Will start patient on beta-blocker.    ____________________________________________   FINAL CLINICAL IMPRESSION(S) / ED DIAGNOSES  Final diagnoses:  Sinus tachycardia  Hyperthyroidism     Note: This dictation was prepared with Dragon dictation. Any transcriptional errors that result from this process are unintentional     Phineas SemenGoodman, Gitty Osterlund, MD 08/11/18 2300

## 2018-08-28 ENCOUNTER — Telehealth: Payer: Self-pay

## 2018-08-28 NOTE — Telephone Encounter (Signed)
Copied from CRM [phone]. Topic: General - Other >> Aug 28, 2018  9:26 AM Percival SpanishKennedy, Cheryl W wrote:  Pt call to say she has been dealingwith headaches for a while and would like to see Dr Birdie SonsSonnenberg or another doctor not the NP

## 2018-08-28 NOTE — Telephone Encounter (Signed)
Sent to PCP would you like to fit her in for an appt or have her see another doctor here?

## 2018-08-29 NOTE — Telephone Encounter (Signed)
I can see her at 11:30 on the 18th.

## 2018-09-01 NOTE — Telephone Encounter (Signed)
Patient scheduled.

## 2018-09-01 NOTE — Telephone Encounter (Signed)
Called and spoke with patient. Pt advised and voiced understanding. Pt advised that she has been scheduled for an appt for 09/10/2018 @ 11:30 please schedule pt for this same day appt for headaches. Thanks   Sent to [facility]

## 2018-09-06 IMAGING — US US EXTREM LOW VENOUS*L*
1 series · 13 of 24 positions shown · non-contrast
Comparison: None.

CLINICAL DATA: Left lower extremity pain



[Series 1: us extrem low venous*left* · 0.06mm/px · 13 of 34 slices shown]
[im 1/34]
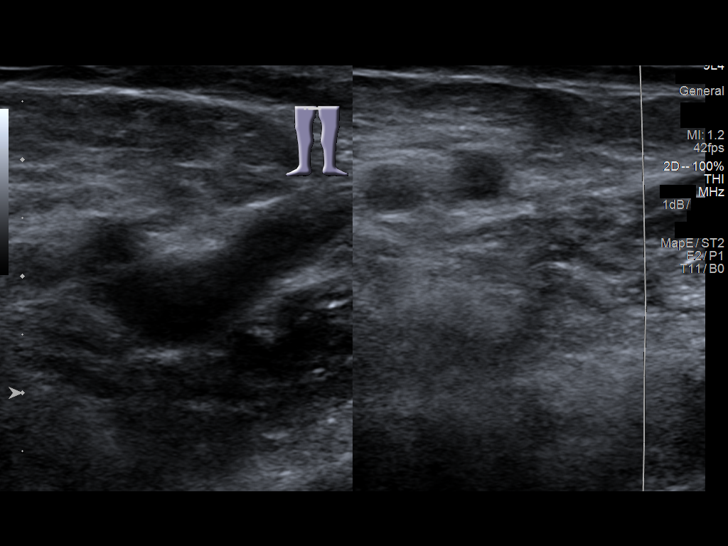
[im 3/34]
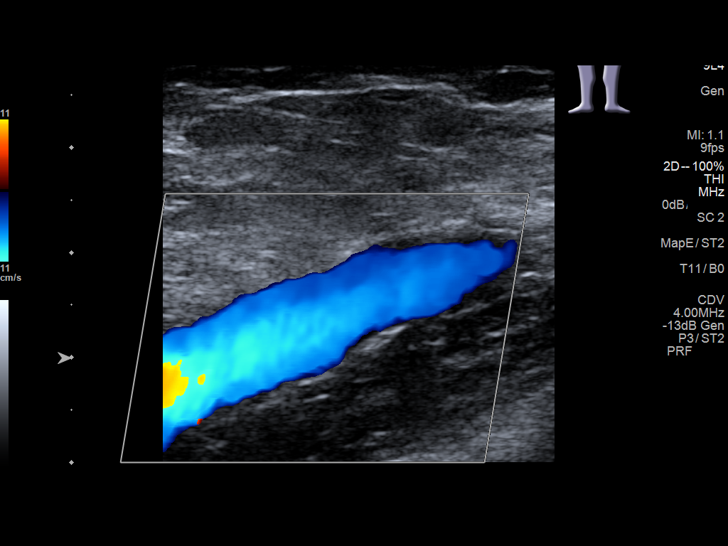
[im 6/34]
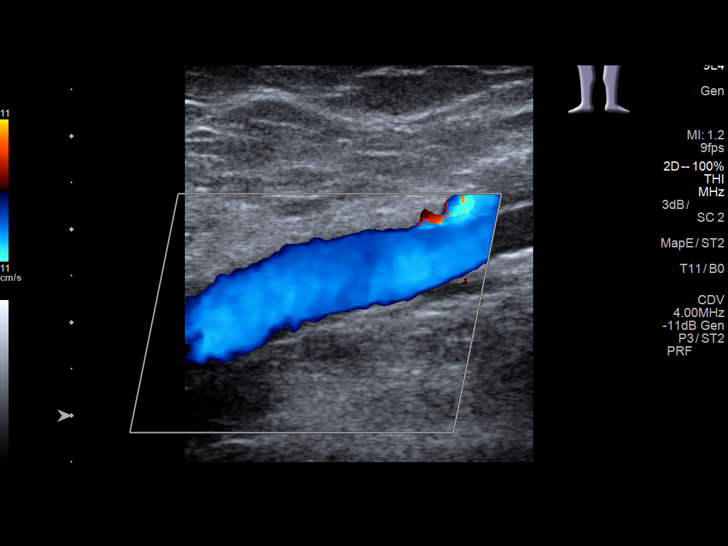
[im 9/34]
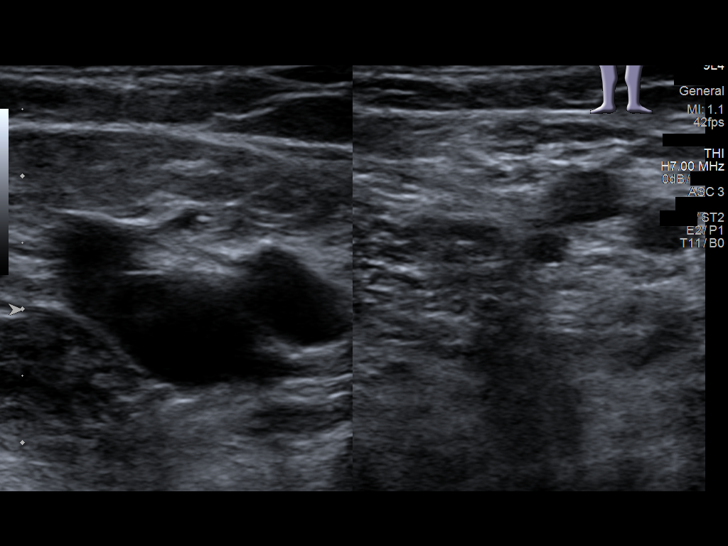
[im 12/34]
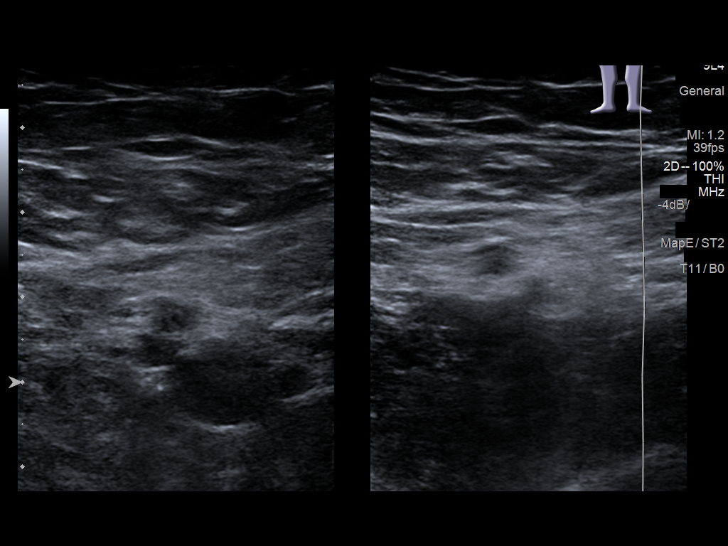
[im 15/34]
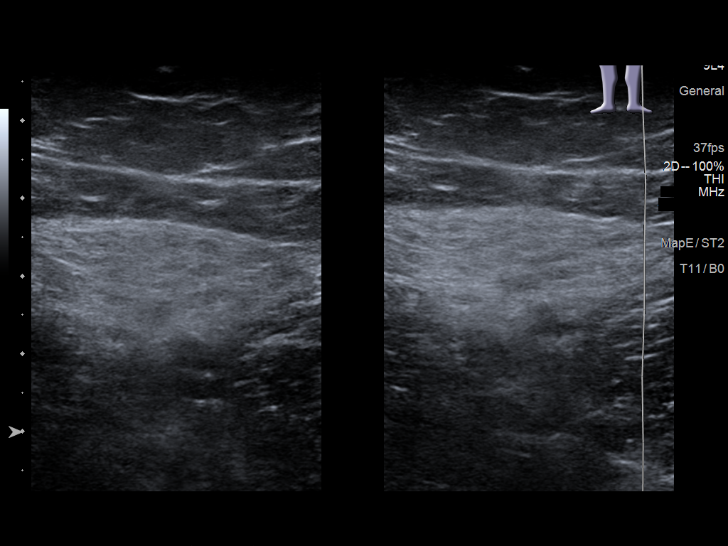
[im 18/34]
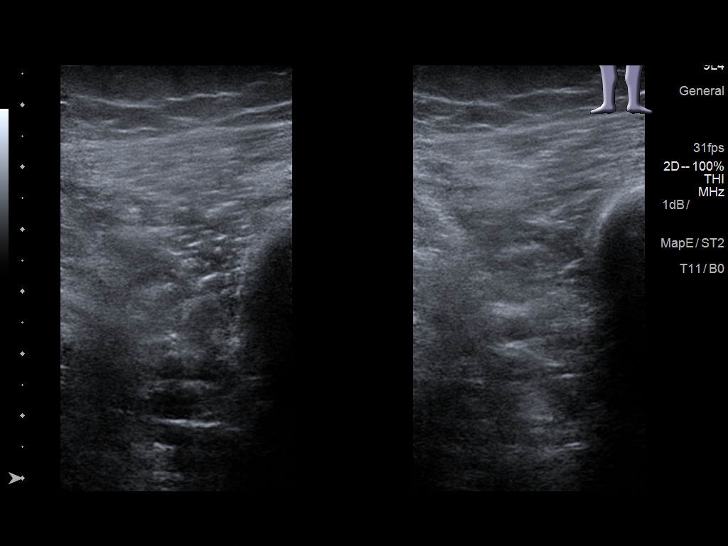
[im 19/34]
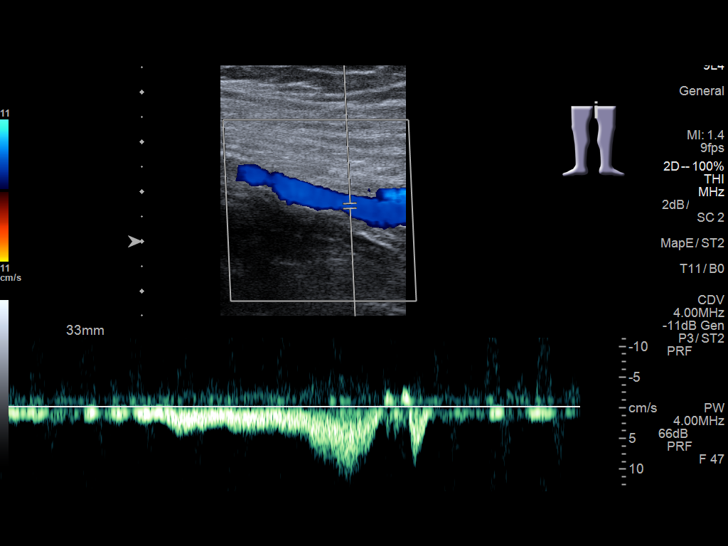
[im 22/34]
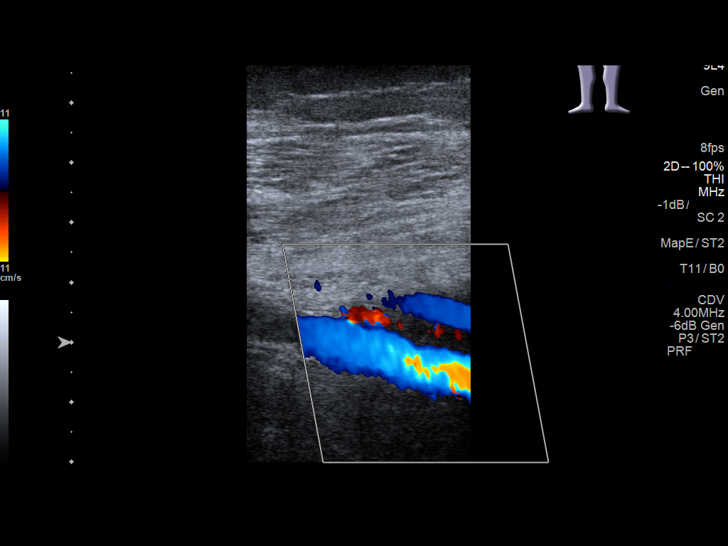
[im 25/34]
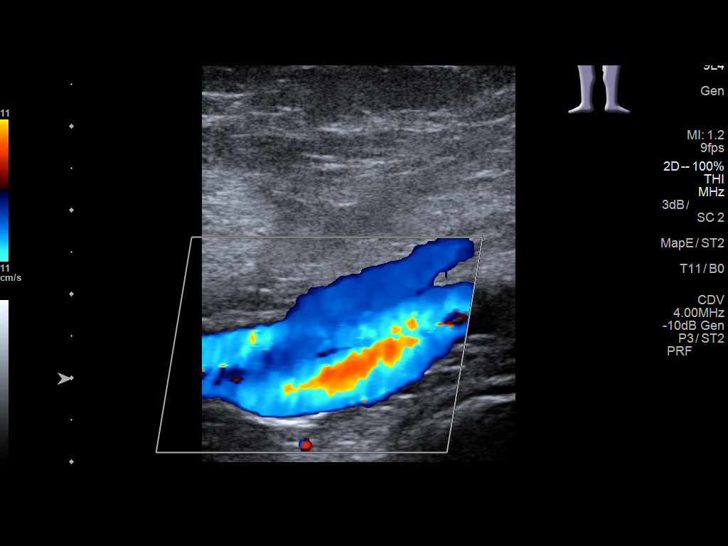
[im 28/34]
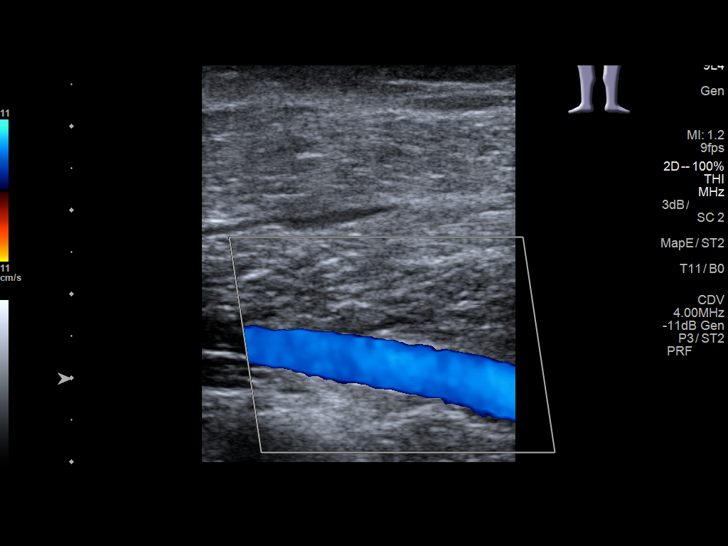
[im 31/34]
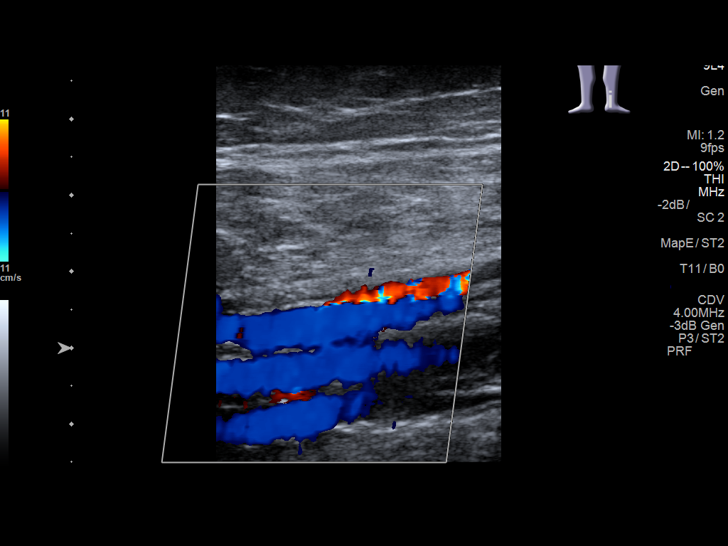
[im 34/34]
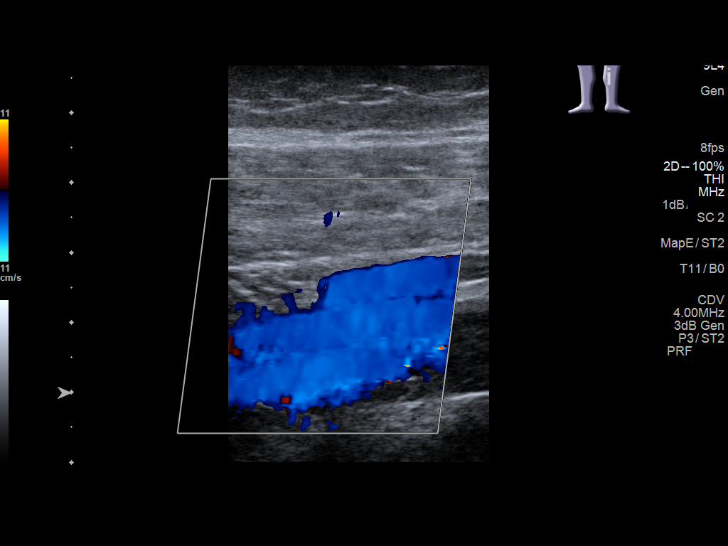

[13 of 24 positions shown; findings below may reference images not displayed]

FINDINGS: Contralateral Common Femoral Vein: Respiratory phasicity is normal
and symmetric with the symptomatic side. No evidence of thrombus.
Normal compressibility.

Common Femoral Vein: No evidence of thrombus. Normal
compressibility, respiratory phasicity and response to augmentation.

Saphenofemoral Junction: No evidence of thrombus. Normal
compressibility and flow on color Doppler imaging.

Profunda Femoral Vein: No evidence of thrombus. Normal
compressibility and flow on color Doppler imaging.

Femoral Vein: No evidence of thrombus. Normal compressibility,
respiratory phasicity and response to augmentation.

Popliteal Vein: No evidence of thrombus. Normal compressibility,
respiratory phasicity and response to augmentation.

Calf Veins: No evidence of thrombus. Normal compressibility and flow
on color Doppler imaging.

Superficial Great Saphenous Vein: No evidence of thrombus. Normal
compressibility.

Venous Reflux:  None.

Other Findings:  None.
IMPRESSION: No evidence of deep venous thrombosis.

## 2018-09-10 ENCOUNTER — Ambulatory Visit (INDEPENDENT_AMBULATORY_CARE_PROVIDER_SITE_OTHER): Payer: BLUE CROSS/BLUE SHIELD | Admitting: Family Medicine

## 2018-09-10 ENCOUNTER — Encounter: Payer: Self-pay | Admitting: Family Medicine

## 2018-09-10 DIAGNOSIS — G43009 Migraine without aura, not intractable, without status migrainosus: Secondary | ICD-10-CM | POA: Diagnosis not present

## 2018-09-10 DIAGNOSIS — G43909 Migraine, unspecified, not intractable, without status migrainosus: Secondary | ICD-10-CM | POA: Insufficient documentation

## 2018-09-10 DIAGNOSIS — E0590 Thyrotoxicosis, unspecified without thyrotoxic crisis or storm: Secondary | ICD-10-CM

## 2018-09-10 NOTE — Progress Notes (Signed)
Lynn AlarEric Sonnenberg, MD Phone: [phone]  Lynn Henry is a 22 y.o. female who presents today for f/u.  CC: Headache, hyperthyroidism  Headache: Patient notes a history of headaches in high school that were more tension in nature.  She notes she delivered her son 7 months ago and for the last 5 to 6 months she has been having migraine headaches.  These started around the time that she started on oral contraceptive pills.  They occur 2-4 times a week.  They typically occur on one side and are throbbing.  She notes photophobia and phonophobia.  She does note some nausea and vomiting with them.  The vomitus is what she had eaten.  No blood.  She notes no vision changes, numbness, or weakness.  She denies aura.  She has tried Excedrin Migraine, Tylenol, ibuprofen, and cut down on caffeine.  No prior history of migraines.  Hypothyroidism: She has followed up with endocrinology.  They have placed her on methimazole.  She remains on propranolol.  Social History   Tobacco Use  Smoking Status Former Smoker  . Packs/day: 0.50  . Types: Cigarettes  . Last attempt to quit: 06/19/2017  . Years since quitting: 1.2  Smokeless Tobacco Never Used  Tobacco Comment   quit when pregnanct     ROS see history of present illness  Objective  Physical Exam Vitals:   09/10/18 1128  BP: 120/70  Pulse: (!) 102  Temp: 98.4 F (36.9 C)  SpO2: 95%    BP Readings from Last 3 Encounters:  09/10/18 120/70  08/11/18 118/80  08/11/18 120/80   Wt Readings from Last 3 Encounters:  09/10/18 129 lb (58.5 kg)  08/11/18 127 lb (57.6 kg)  08/11/18 128 lb (58.1 kg)    Physical Exam Constitutional:      General: She is not in acute distress.    Appearance: She is not diaphoretic.  Cardiovascular:     Rate and Rhythm: Normal rate and regular rhythm.     Heart sounds: Normal heart sounds.  Pulmonary:     Effort: Pulmonary effort is normal.     Breath sounds: Normal breath sounds.  Skin:  General: Skin is warm and dry.  Neurological:     Mental Status: She is alert.     Comments: CN 2-12 intact, 5/5 strength in bilateral biceps, triceps, grip, quads, hamstrings, plantar and dorsiflexion, sensation to light touch intact in bilateral UE and LE, normal gait, 2+ patellar reflexes      Assessment/Plan: Please see individual problem list.  Migraine Headache is most consistent with migraine.  There is no aura.  I discussed that her oral contraceptive pills could be contributing to this particularly given the time course of when her migraine started.  She does report her grandmother had similar issues on contraceptive pills previously.  We will have her discontinue her contraceptive pills to see if her migraine improves.  Discussed using alternative form of birth control such as condoms while she is off of her birth control pills.  Discussed having her see her gynecologist to determine alternative methods of birth control particularly if her migraines improve off of medication.  Hyperthyroidism Patient will continue management through her endocrinologist.  Discussed that this could potentially be contributing to her headaches as well.    No orders of the defined types were placed in this encounter.   No orders of the defined types were placed in this encounter.    Lynn AlarEric Sonnenberg, MD [facility]  

## 2018-09-10 NOTE — Assessment & Plan Note (Signed)
Headache is most consistent with migraine.  There is no aura.  I discussed that her oral contraceptive pills could be contributing to this particularly given the time course of when her migraine started.  She does report her grandmother had similar issues on contraceptive pills previously.  We will have her discontinue her contraceptive pills to see if her migraine improves.  Discussed using alternative form of birth control such as condoms while she is off of her birth control pills.  Discussed having her see her gynecologist to determine alternative methods of birth control particularly if her migraines improve off of medication.

## 2018-09-10 NOTE — Patient Instructions (Signed)
Nice to see you. I would suggest a trial off of your birth control to see if that is contributing to your migraines.  I would additionally advise discussing alternative birth control options with your gynecologist. You should use an alternative form of birth control such as condoms while off the oral contraceptive medication.

## 2018-09-10 NOTE — Assessment & Plan Note (Signed)
Patient will continue management through her endocrinologist.  Discussed that this could potentially be contributing to her headaches as well.

## 2018-11-19 ENCOUNTER — Encounter: Payer: Self-pay | Admitting: Family Medicine

## 2018-11-19 ENCOUNTER — Ambulatory Visit (INDEPENDENT_AMBULATORY_CARE_PROVIDER_SITE_OTHER): Payer: PRIVATE HEALTH INSURANCE | Admitting: Family Medicine

## 2018-11-19 DIAGNOSIS — Z30011 Encounter for initial prescription of contraceptive pills: Secondary | ICD-10-CM | POA: Diagnosis not present

## 2018-11-19 DIAGNOSIS — Z309 Encounter for contraceptive management, unspecified: Secondary | ICD-10-CM | POA: Insufficient documentation

## 2018-11-19 DIAGNOSIS — G43009 Migraine without aura, not intractable, without status migrainosus: Secondary | ICD-10-CM

## 2018-11-19 MED ORDER — NORETHINDRONE 0.35 MG PO TABS: 1.0000 | ORAL_TABLET | Freq: Every day | ORAL | 3 refills | Status: DC

## 2018-11-19 NOTE — Patient Instructions (Signed)
Nice to see you. I am glad your headaches have resolved. We are going to start you on norethindrone for birth control.  Please start this on the first day of your next menstrual cycle.  You need to take this at the exact same time every day.  If you miss a dose by more than 3 hours you need to use condoms as an alternative form of birth control for the next 48 hours.

## 2018-11-19 NOTE — Progress Notes (Signed)
  Marikay Alar, MD Phone: [phone]  Lynn Henry is a 23 y.o. female who presents today for f/u.  CC: headaches, contraceptive management  Patient reports her headaches have resolved.  They lasted about a week longer after she discontinued her combined OCP though she has not had any since then.  She is not using anything for contraception currently.  She is not using condoms.  LMP was 11/06/2018.  Has a menstrual cycle once monthly.  Last 5 to 7 days.  Normal amount of bleeding.  She is not breast-feeding.  Social History   Tobacco Use  Smoking Status Former Smoker  . Packs/day: 0.50  . Types: Cigarettes  . Last attempt to quit: 06/19/2017  . Years since quitting: 1.4  Smokeless Tobacco Never Used  Tobacco Comment   quit when pregnanct     ROS see history of present illness  Objective  Physical Exam Vitals:   11/19/18 1419  BP: 120/80  Pulse: 66  Temp: 98.6 F (37 C)  SpO2: 98%    BP Readings from Last 3 Encounters:  11/19/18 120/80  09/10/18 120/70  08/11/18 118/80   Wt Readings from Last 3 Encounters:  11/19/18 134 lb (60.8 kg)  09/10/18 129 lb (58.5 kg)  08/11/18 127 lb (57.6 kg)    Physical Exam Constitutional:      General: She is not in acute distress.    Appearance: She is not diaphoretic.  Cardiovascular:     Rate and Rhythm: Normal rate and regular rhythm.     Heart sounds: Normal heart sounds.  Pulmonary:     Effort: Pulmonary effort is normal.     Breath sounds: Normal breath sounds.  Musculoskeletal:     Right lower leg: No edema.     Left lower leg: No edema.  Skin:    General: Skin is warm and dry.  Neurological:     Mental Status: She is alert.      Assessment/Plan: Please see individual problem list.  Migraine Headaches have resolved.  Suspect these were related to her contraceptive pill.  She will monitor for recurrence.  Contraceptive management Discussed nonestrogen-based contraceptive methods including IUD,  Implanon, Depo-Provera, and progesterone only pill.  Patient opted for progesterone only pill.  Discussed starting this on the first day of her next menstrual cycle.  Discussed using condoms until then.  Discussed the need to take this medication at the same time each day and if the dose is missed taking it as soon as she remembers and using backup contraception with condoms for 48 hours.   No orders of the defined types were placed in this encounter.   Meds ordered this encounter  Medications  . norethindrone (MICRONOR,CAMILA,ERRIN) 0.35 MG tablet    Sig: Take 1 tablet (0.35 mg total) by mouth daily.    Dispense:  3 Package    Refill:  3     Marikay Alar, MD [facility]

## 2018-11-19 NOTE — Assessment & Plan Note (Signed)
Headaches have resolved.  Suspect these were related to her contraceptive pill.  She will monitor for recurrence.

## 2018-11-20 NOTE — Assessment & Plan Note (Signed)
Discussed nonestrogen-based contraceptive methods including IUD, Implanon, Depo-Provera, and progesterone only pill.  Patient opted for progesterone only pill.  Discussed starting this on the first day of her next menstrual cycle.  Discussed using condoms until then.  Discussed the need to take this medication at the same time each day and if the dose is missed taking it as soon as she remembers and using backup contraception with condoms for 48 hours.

## 2019-08-13 DIAGNOSIS — E0500 Thyrotoxicosis with diffuse goiter without thyrotoxic crisis or storm: Secondary | ICD-10-CM | POA: Insufficient documentation

## 2019-09-08 ENCOUNTER — Ambulatory Visit: Payer: PRIVATE HEALTH INSURANCE | Admitting: Internal Medicine

## 2019-09-25 NOTE — L&D Delivery Note (Signed)
Delivery Note At 4:23 PM a viable and healthy female was delivered via Vaginal, Spontaneous (Presentation: Right  Occiput Anterior).  APGAR: 7, 9; pending weight  .   Placenta status: Spontaneous, Intact.  Cord: 3 vessels loose nuchal x 1   Anesthesia: Epidural Episiotomy:  None Lacerations:  2nd degree perineal Suture Repair: 3.0 vicryl Est. Blood Loss (mL):  203 cc  Mom to postpartum.  Baby to Couplet care / Skin to Skin.  [name] 05/25/2020, 4:43 PM

## 2019-11-13 LAB — OB RESULTS CONSOLE HIV ANTIBODY (ROUTINE TESTING): HIV: NONREACTIVE

## 2019-11-13 LAB — OB RESULTS CONSOLE RUBELLA ANTIBODY, IGM: Rubella: IMMUNE

## 2019-11-13 LAB — OB RESULTS CONSOLE HEPATITIS B SURFACE ANTIGEN: Hepatitis B Surface Ag: NEGATIVE

## 2019-11-23 ENCOUNTER — Ambulatory Visit: Payer: PRIVATE HEALTH INSURANCE | Admitting: Family Medicine

## 2020-03-02 LAB — OB RESULTS CONSOLE RPR: RPR: NONREACTIVE

## 2020-05-05 LAB — OB RESULTS CONSOLE GC/CHLAMYDIA
Chlamydia: NEGATIVE
Gonorrhea: NEGATIVE

## 2020-05-06 LAB — OB RESULTS CONSOLE GBS: GBS: NEGATIVE

## 2020-05-25 ENCOUNTER — Encounter (HOSPITAL_COMMUNITY): Payer: Self-pay | Admitting: Obstetrics and Gynecology

## 2020-05-25 ENCOUNTER — Inpatient Hospital Stay (HOSPITAL_COMMUNITY): Payer: 59 | Admitting: Anesthesiology

## 2020-05-25 ENCOUNTER — Other Ambulatory Visit: Payer: Self-pay

## 2020-05-25 ENCOUNTER — Inpatient Hospital Stay (HOSPITAL_COMMUNITY): Payer: 59

## 2020-05-25 ENCOUNTER — Inpatient Hospital Stay (HOSPITAL_COMMUNITY)
Admission: RE | Admit: 2020-05-25 | Discharge: 2020-05-27 | DRG: 807 | Disposition: A | Discharge status: Home / Self Care | Payer: 59 | Attending: Obstetrics and Gynecology | Admitting: Obstetrics and Gynecology

## 2020-05-25 DIAGNOSIS — O26893 Other specified pregnancy related conditions, third trimester: Secondary | ICD-10-CM | POA: Diagnosis present

## 2020-05-25 DIAGNOSIS — Z87891 Personal history of nicotine dependence: Secondary | ICD-10-CM

## 2020-05-25 DIAGNOSIS — E0500 Thyrotoxicosis with diffuse goiter without thyrotoxic crisis or storm: Secondary | ICD-10-CM | POA: Diagnosis present

## 2020-05-25 DIAGNOSIS — O99284 Endocrine, nutritional and metabolic diseases complicating childbirth: Secondary | ICD-10-CM | POA: Diagnosis present

## 2020-05-25 DIAGNOSIS — Z20822 Contact with and (suspected) exposure to covid-19: Secondary | ICD-10-CM | POA: Diagnosis present

## 2020-05-25 DIAGNOSIS — Z3A39 39 weeks gestation of pregnancy: Secondary | ICD-10-CM

## 2020-05-25 HISTORY — DX: Thyrotoxicosis, unspecified without thyrotoxic crisis or storm: E05.90

## 2020-05-25 HISTORY — DX: Gastro-esophageal reflux disease without esophagitis: K21.9

## 2020-05-25 LAB — CBC
HCT: 36.4 % (ref 36.0–46.0)
Hemoglobin: 12.2 g/dL (ref 12.0–15.0)
MCH: 30.8 pg (ref 26.0–34.0)
MCHC: 33.5 g/dL (ref 30.0–36.0)
MCV: 91.9 fL (ref 80.0–100.0)
Platelets: 201 10*3/uL (ref 150–400)
RBC: 3.96 MIL/uL (ref 3.87–5.11)
RDW: 13.5 % (ref 11.5–15.5)
WBC: 8.3 10*3/uL (ref 4.0–10.5)
nRBC: 0 % (ref 0.0–0.2)

## 2020-05-25 LAB — RPR: RPR Ser Ql: NONREACTIVE

## 2020-05-25 LAB — TYPE AND SCREEN
ABO/RH(D): O POS
Antibody Screen: NEGATIVE

## 2020-05-25 LAB — SARS CORONAVIRUS 2 BY RT PCR (HOSPITAL ORDER, PERFORMED IN ~~LOC~~ HOSPITAL LAB): SARS Coronavirus 2: NEGATIVE

## 2020-05-25 MED ORDER — DIPHENHYDRAMINE HCL 50 MG/ML IJ SOLN: 12.5000 mg | INTRAMUSCULAR | Status: DC | PRN

## 2020-05-25 MED ORDER — ONDANSETRON HCL 4 MG/2ML IJ SOLN
4.0000 mg | Freq: Four times a day (QID) | INTRAMUSCULAR | Status: DC | PRN
  Administered 2020-05-25: 4 mg via INTRAVENOUS
  Filled 2020-05-25: qty 2

## 2020-05-25 MED ORDER — EPHEDRINE 5 MG/ML INJ: 10.0000 mg | INTRAVENOUS | Status: DC | PRN

## 2020-05-25 MED ORDER — PHENYLEPHRINE 40 MCG/ML (10ML) SYRINGE FOR IV PUSH (FOR BLOOD PRESSURE SUPPORT)
80.0000 ug | PREFILLED_SYRINGE | INTRAVENOUS | Status: DC | PRN
  Filled 2020-05-25: qty 10

## 2020-05-25 MED ORDER — LACTATED RINGERS IV SOLN: 500.0000 mL | INTRAVENOUS | Status: DC | PRN

## 2020-05-25 MED ORDER — OXYCODONE HCL 5 MG PO TABS: 10.0000 mg | ORAL_TABLET | ORAL | Status: DC | PRN

## 2020-05-25 MED ORDER — IBUPROFEN 600 MG PO TABS
600.0000 mg | ORAL_TABLET | Freq: Four times a day (QID) | ORAL | Status: DC
  Administered 2020-05-26 – 2020-05-27 (×7): 600 mg via ORAL
  Filled 2020-05-25 (×7): qty 1

## 2020-05-25 MED ORDER — SODIUM CHLORIDE (PF) 0.9 % IJ SOLN
INTRAMUSCULAR | Status: DC | PRN
Start: 2020-05-25 — End: 2020-05-25
  Administered 2020-05-25: 12 mL/h via EPIDURAL

## 2020-05-25 MED ORDER — OXYTOCIN-SODIUM CHLORIDE 30-0.9 UT/500ML-% IV SOLN: 2.5000 [IU]/h | INTRAVENOUS | Status: DC

## 2020-05-25 MED ORDER — BENZOCAINE-MENTHOL 20-0.5 % EX AERO
1.0000 "application " | INHALATION_SPRAY | CUTANEOUS | Status: DC | PRN
  Filled 2020-05-25: qty 56

## 2020-05-25 MED ORDER — LIDOCAINE HCL (PF) 1 % IJ SOLN: 30.0000 mL | INTRAMUSCULAR | Status: DC | PRN

## 2020-05-25 MED ORDER — PHENYLEPHRINE 40 MCG/ML (10ML) SYRINGE FOR IV PUSH (FOR BLOOD PRESSURE SUPPORT)
80.0000 ug | PREFILLED_SYRINGE | INTRAVENOUS | Status: AC | PRN
  Administered 2020-05-25 (×3): 80 ug via INTRAVENOUS

## 2020-05-25 MED ORDER — PRENATAL MULTIVITAMIN CH
1.0000 | ORAL_TABLET | Freq: Every day | ORAL | Status: DC
  Administered 2020-05-27: 1 via ORAL
  Filled 2020-05-25: qty 1

## 2020-05-25 MED ORDER — DIBUCAINE (PERIANAL) 1 % EX OINT: 1.0000 "application " | TOPICAL_OINTMENT | CUTANEOUS | Status: DC | PRN

## 2020-05-25 MED ORDER — OXYTOCIN-SODIUM CHLORIDE 30-0.9 UT/500ML-% IV SOLN
1.0000 m[IU]/min | INTRAVENOUS | Status: DC
  Administered 2020-05-25: 4 m[IU]/min via INTRAVENOUS
  Administered 2020-05-25: 2 m[IU]/min via INTRAVENOUS
  Filled 2020-05-25: qty 500

## 2020-05-25 MED ORDER — ZOLPIDEM TARTRATE 5 MG PO TABS: 5.0000 mg | ORAL_TABLET | Freq: Every evening | ORAL | Status: DC | PRN

## 2020-05-25 MED ORDER — METHYLERGONOVINE MALEATE 0.2 MG PO TABS: 0.2000 mg | ORAL_TABLET | ORAL | Status: DC | PRN

## 2020-05-25 MED ORDER — OXYTOCIN BOLUS FROM INFUSION
333.0000 mL | Freq: Once | INTRAVENOUS | Status: AC
  Administered 2020-05-25: 333 mL via INTRAVENOUS

## 2020-05-25 MED ORDER — FENTANYL-BUPIVACAINE-NACL 0.5-0.125-0.9 MG/250ML-% EP SOLN
12.0000 mL/h | EPIDURAL | Status: DC | PRN
  Filled 2020-05-25: qty 250

## 2020-05-25 MED ORDER — LACTATED RINGERS IV SOLN: 500.0000 mL | Freq: Once | INTRAVENOUS | Status: DC

## 2020-05-25 MED ORDER — SIMETHICONE 80 MG PO CHEW: 80.0000 mg | CHEWABLE_TABLET | ORAL | Status: DC | PRN

## 2020-05-25 MED ORDER — TETANUS-DIPHTH-ACELL PERTUSSIS 5-2.5-18.5 LF-MCG/0.5 IM SUSP: 0.5000 mL | Freq: Once | INTRAMUSCULAR | Status: DC

## 2020-05-25 MED ORDER — OXYCODONE HCL 5 MG PO TABS: 5.0000 mg | ORAL_TABLET | ORAL | Status: DC | PRN

## 2020-05-25 MED ORDER — ACETAMINOPHEN 325 MG PO TABS: 650.0000 mg | ORAL_TABLET | ORAL | Status: DC | PRN

## 2020-05-25 MED ORDER — ONDANSETRON HCL 4 MG PO TABS: 4.0000 mg | ORAL_TABLET | ORAL | Status: DC | PRN

## 2020-05-25 MED ORDER — LACTATED RINGERS IV SOLN: INTRAVENOUS | Status: DC

## 2020-05-25 MED ORDER — ACETAMINOPHEN 325 MG PO TABS
650.0000 mg | ORAL_TABLET | ORAL | Status: DC | PRN
  Administered 2020-05-25 – 2020-05-27 (×6): 650 mg via ORAL
  Filled 2020-05-25 (×6): qty 2

## 2020-05-25 MED ORDER — COCONUT OIL OIL
1.0000 "application " | TOPICAL_OIL | Status: DC | PRN
  Administered 2020-05-27: 1 via TOPICAL

## 2020-05-25 MED ORDER — WITCH HAZEL-GLYCERIN EX PADS
1.0000 "application " | MEDICATED_PAD | CUTANEOUS | Status: DC | PRN
  Administered 2020-05-26: 1 via TOPICAL

## 2020-05-25 MED ORDER — OXYCODONE-ACETAMINOPHEN 5-325 MG PO TABS: 2.0000 | ORAL_TABLET | ORAL | Status: DC | PRN

## 2020-05-25 MED ORDER — SOD CITRATE-CITRIC ACID 500-334 MG/5ML PO SOLN: 30.0000 mL | ORAL | Status: DC | PRN

## 2020-05-25 MED ORDER — TERBUTALINE SULFATE 1 MG/ML IJ SOLN: 0.2500 mg | Freq: Once | INTRAMUSCULAR | Status: DC | PRN

## 2020-05-25 MED ORDER — METHYLERGONOVINE MALEATE 0.2 MG/ML IJ SOLN: 0.2000 mg | INTRAMUSCULAR | Status: DC | PRN

## 2020-05-25 MED ORDER — DIPHENHYDRAMINE HCL 25 MG PO CAPS: 25.0000 mg | ORAL_CAPSULE | Freq: Four times a day (QID) | ORAL | Status: DC | PRN

## 2020-05-25 MED ORDER — OXYCODONE-ACETAMINOPHEN 5-325 MG PO TABS: 1.0000 | ORAL_TABLET | ORAL | Status: DC | PRN

## 2020-05-25 MED ORDER — ONDANSETRON HCL 4 MG/2ML IJ SOLN: 4.0000 mg | INTRAMUSCULAR | Status: DC | PRN

## 2020-05-25 MED ORDER — LIDOCAINE HCL (PF) 1 % IJ SOLN
INTRAMUSCULAR | Status: DC | PRN
  Administered 2020-05-25: 11 mL via EPIDURAL

## 2020-05-25 MED ORDER — SENNOSIDES-DOCUSATE SODIUM 8.6-50 MG PO TABS
2.0000 | ORAL_TABLET | ORAL | Status: DC
  Administered 2020-05-26 (×2): 2 via ORAL
  Filled 2020-05-25 (×2): qty 2

## 2020-05-25 NOTE — Anesthesia Procedure Notes (Signed)
Epidural Patient location during procedure: OB Start time: 05/25/2020 12:01 PM End time: 05/25/2020 12:11 PM  Staffing Anesthesiologist: Elmer Picker, MD Performed: anesthesiologist   Preanesthetic Checklist Completed: patient identified, IV checked, risks and benefits discussed, monitors and equipment checked, pre-op evaluation and timeout performed  Epidural Patient position: sitting Prep: DuraPrep and site prepped and draped Patient monitoring: continuous pulse ox, blood pressure, heart rate and cardiac monitor Approach: midline Location: L3-L4 Injection technique: LOR air  Needle:  Needle type: Tuohy  Needle gauge: 17 G Needle length: 9 cm Needle insertion depth: 4.5 cm Catheter type: closed end flexible Catheter size: 19 Gauge Catheter at skin depth: 10.5 cm Test dose: negative  Assessment Sensory level: T8 Events: blood not aspirated, injection not painful, no injection resistance, no paresthesia and negative IV test  Additional Notes Patient identified. Risks/Benefits/Options discussed with patient including but not limited to bleeding, infection, nerve damage, paralysis, failed block, incomplete pain control, headache, blood pressure changes, nausea, vomiting, reactions to medication both or allergic, itching and postpartum back pain. Confirmed with bedside nurse the patient's most recent platelet count. Confirmed with patient that they are not currently taking any anticoagulation, have any bleeding history or any family history of bleeding disorders. Patient expressed understanding and wished to proceed. All questions were answered. Sterile technique was used throughout the entire procedure. Please see nursing notes for vital signs. Test dose was given through epidural catheter and negative prior to continuing to dose epidural or start infusion. Warning signs of high block given to the patient including shortness of breath, tingling/numbness in hands, complete motor  block, or any concerning symptoms with instructions to call for help. Patient was given instructions on fall risk and not to get out of bed. All questions and concerns addressed with instructions to call with any issues or inadequate analgesia.  Reason for block:procedure for pain

## 2020-05-25 NOTE — Anesthesia Preprocedure Evaluation (Addendum)
Anesthesia Evaluation  Patient identified by MRN, date of birth, ID band Patient awake    Reviewed: Allergy & Precautions, NPO status , Patient's Chart, lab work & pertinent test results  Airway Mallampati: II  TM Distance: >3 FB Neck ROM: Full    Dental no notable dental hx.    Pulmonary neg pulmonary ROS, former smoker,    Pulmonary exam normal breath sounds clear to auscultation       Cardiovascular negative cardio ROS Normal cardiovascular exam Rhythm:Regular Rate:Normal     Neuro/Psych  Headaches, negative psych ROS   GI/Hepatic Neg liver ROS, GERD  ,  Endo/Other  negative endocrine ROS  Renal/GU negative Renal ROS  negative genitourinary   Musculoskeletal negative musculoskeletal ROS (+)   Abdominal   Peds  Hematology negative hematology ROS (+)   Anesthesia Other Findings   Reproductive/Obstetrics (+) Pregnancy                            Anesthesia Physical Anesthesia Plan  ASA: II  Anesthesia Plan: Epidural   Post-op Pain Management:    Induction:   PONV Risk Score and Plan: Treatment may vary due to age or medical condition  Airway Management Planned: Natural Airway  Additional Equipment:   Intra-op Plan:   Post-operative Plan:   Informed Consent: I have reviewed the patients History and Physical, chart, labs and discussed the procedure including the risks, benefits and alternatives for the proposed anesthesia with the patient or authorized representative who has indicated his/her understanding and acceptance.       Plan Discussed with: Anesthesiologist  Anesthesia Plan Comments: (Patient identified. Risks, benefits, options discussed with patient including but not limited to bleeding, infection, nerve damage, paralysis, failed block, incomplete pain control, headache, blood pressure changes, nausea, vomiting, reactions to medication, itching, and post partum  back pain. Confirmed with bedside nurse the patient's most recent platelet count. Confirmed with the patient that they are not taking any anticoagulation, have any bleeding history or any family history of bleeding disorders. Patient expressed understanding and wishes to proceed. All questions were answered. )        Anesthesia Quick Evaluation

## 2020-05-25 NOTE — H&P (Signed)
Lynn Henry is a 24 y.o. female presenting for EIOL  24 yo G2P1001 @ 39+0 presents for EIOL. Her pregnancy has been complicated by graves disease but she is not on any medication. Endo follows.   OB History    Gravida  2   Para  1   Term  1   Preterm      AB      Living  1     SAB      TAB      Ectopic      Multiple  0   Live Births  1          Past Medical History:  Diagnosis Date  . Chronic abdominal pain    presumed IBS  . GERD (gastroesophageal reflux disease)   . Hypertension    gestational HTN towards end of pregnancy  . Hyperthyroidism   . IBS (irritable bowel syndrome)   . Spontaneous vaginal delivery 02/17/2018   Past Surgical History:  Procedure Laterality Date  . TOOTH EXTRACTION     Family History: family history includes Arthritis in her paternal grandmother; Diabetes in her paternal grandfather; Heart disease in her maternal grandfather; Hyperlipidemia in her maternal grandfather and mother; Hypertension in her maternal grandfather and mother; Prostate cancer in her maternal grandfather and paternal grandfather; Stroke in her maternal grandfather. Social History:  reports that she quit smoking about 2 years ago. Her smoking use included cigarettes. She smoked 0.50 packs per day. She has never used smokeless tobacco. She reports that she does not drink alcohol and does not use drugs.     Maternal Diabetes: No Genetic Screening: Normal Maternal Ultrasounds/Referrals: Normal Fetal Ultrasounds or other Referrals:  None Maternal Substance Abuse:  No Significant Maternal Medications:  None Significant Maternal Lab Results:  None Other Comments:  None  Review of Systems History   not currently breastfeeding. Exam Physical Exam  Prenatal labs: ABO, Rh:  O pos Antibody:  neg Rubella:  imm RPR:   NR HBsAg:   Neg HIV:   NR GBS:   Neg  Assessment/Plan: 1) Admit 2) AROM/pit 3) Epidural on request   [name] 05/25/2020, 9:03  AM

## 2020-05-25 NOTE — Progress Notes (Signed)
Patient symptomatic when position changed to semi-fowlers. Dr. Armond Hang called and phenylephrine order received.

## 2020-05-26 LAB — CBC
HCT: 31.5 % — ABNORMAL LOW (ref 36.0–46.0)
Hemoglobin: 10.6 g/dL — ABNORMAL LOW (ref 12.0–15.0)
MCH: 31.1 pg (ref 26.0–34.0)
MCHC: 33.7 g/dL (ref 30.0–36.0)
MCV: 92.4 fL (ref 80.0–100.0)
Platelets: 171 10*3/uL (ref 150–400)
RBC: 3.41 MIL/uL — ABNORMAL LOW (ref 3.87–5.11)
RDW: 13.6 % (ref 11.5–15.5)
WBC: 10.1 10*3/uL (ref 4.0–10.5)
nRBC: 0 % (ref 0.0–0.2)

## 2020-05-26 NOTE — Anesthesia Postprocedure Evaluation (Signed)
Anesthesia Post Note  Patient: Arley Phenix  Procedure(s) Performed: AN AD HOC LABOR EPIDURAL     Patient location during evaluation: Mother Baby Anesthesia Type: Epidural Level of consciousness: awake, awake and alert and oriented Pain management: pain level controlled Vital Signs Assessment: post-procedure vital signs reviewed and stable Respiratory status: spontaneous breathing, nonlabored ventilation and respiratory function stable Cardiovascular status: stable Postop Assessment: no headache, patient able to bend at knees, no apparent nausea or vomiting, adequate PO intake and able to ambulate Anesthetic complications: no   No complications documented.  Last Vitals:  Vitals:   05/26/20 0017 05/26/20 0512  BP: 119/79 105/72  Pulse: 94 83  Resp: 16 18  Temp: 36.9 C 36.7 C  SpO2: 99% 99%    Last Pain:  Vitals:   05/26/20 0512  TempSrc: Oral  PainSc: 4    Pain Goal: Patients Stated Pain Goal: 0 (05/25/20 1452)                 Jeno Calleros

## 2020-05-26 NOTE — Progress Notes (Signed)
Patient is eating, ambulating, voiding.  Pain control is good.  Appropriate lochia, no complaints.  Vitals:   05/25/20 1956 05/26/20 0017 05/26/20 0512 05/26/20 0915  BP: 118/84 119/79 105/72 120/84  Pulse: (!) 103 94 83 92  Resp: 20 16 18 18   Temp: 98.9 F (37.2 C) 98.4 F (36.9 C) 98 F (36.7 C) 97.6 F (36.4 C)  TempSrc: Oral Oral Oral Oral  SpO2: 100% 99% 99% 100%  Weight:      Height:        Fundus firm Ext: no calf tenderness  Lab Results  Component Value Date   WBC 10.1 05/26/2020   HGB 10.6 (L) 05/26/2020   HCT 31.5 (L) 05/26/2020   MCV 92.4 05/26/2020   PLT 171 05/26/2020    --/--/O POS (09/01 0912)  A/P Post partum day 1. Doing well Circ desired, consent obtained, performed.  Routine care.  Expect d/c 9/3.    11/3

## 2020-05-27 NOTE — Progress Notes (Signed)
Patient is eating, ambulating, voiding.  Pain control is good.  Vitals:   05/26/20 0915 05/26/20 1422 05/26/20 1952 05/27/20 0638  BP: 120/84 120/89 118/88   Pulse: 92 (!) 102 77   Resp: 18 19 18 16   Temp: 97.6 F (36.4 C) 98.5 F (36.9 C) 98.5 F (36.9 C) 97.9 F (36.6 C)  TempSrc: Oral Oral Oral Oral  SpO2: 100% 99% 100% 100%  Weight:      Height:        Fundus firm Perineum without swelling.  Lab Results  Component Value Date   WBC 10.1 05/26/2020   HGB 10.6 (L) 05/26/2020   HCT 31.5 (L) 05/26/2020   MCV 92.4 05/26/2020   PLT 171 05/26/2020    --/--/O POS (09/01 0912)/RI  A/P Post partum day 2.  Routine care.  Expect d/c today.    02-20-1973

## 2020-05-27 NOTE — Lactation Note (Signed)
This note was copied from a baby's chart. Lactation Consultation Note  Patient Name: Lynn Henry EXBMW'[id number] Date: 05/27/2020 Reason for consult: Follow-up assessment  P2 mother whose infant is now 34 hours old.  This is a term baby at 39+0 weeks.  Mother breast tfed her first child (now 24 years old) for only three days.  RN requested latch assistance.  Mother had previously declined a lactation consult, however, she is requesting a visit prior to discharge.  Reviewed breast feeding basics with mother.  Her breasts are soft and non tender and nipples are everted.  Her left nipple has a very reddened compression stripe.  Her right nipple is intact.  RN in room with mother.  She has provided comfort gels.  Also discussed using her EBM and rubbing drops into her nipple/areolas prior to using the organic nipple balm she has at bedside and/or the comfort gels.  Mother may pump the left breast for a few sessions until her nipple is feeling less sore.  Discussed continuing to do breast massage and hand expression before/after pumping.  Engorgement prevention/treatment reviewed.  Mother has a manual pump at bedside.  She also has a DEBP for home use.    Parents very receptive to learning.  Father present and supportive.  RN updated.  Mother has our OP phone number for questions after discharge.     Maternal Data    Feeding Feeding Type: Breast Fed  LATCH Score Latch: Repeated attempts needed to sustain latch, nipple held in mouth throughout feeding, stimulation needed to elicit sucking reflex.  Audible Swallowing: A few with stimulation  Type of Nipple: Everted at rest and after stimulation  Comfort (Breast/Nipple): Filling, red/small blisters or bruises, mild/mod discomfort  Hold (Positioning): Assistance needed to correctly position infant at breast and maintain latch.  LATCH Score: 6  Interventions Interventions: Breast feeding basics reviewed;Assisted with latch;Skin to  skin;Breast massage;Hand express;Breast compression;Adjust position;Hand pump;Position options;Support pillows  Lactation Tools Discussed/Used     Consult Status Consult Status: Complete    Lynn Henry 05/27/2020, 12:47 PM

## 2020-05-27 NOTE — Discharge Summary (Signed)
Postpartum Discharge Summary  Date of Service updated      Patient Name: Lynn Henry DOB: 1996-02-18 MRN: [id number]  Date of admission: 05/25/2020 Delivery date:05/25/2020  Delivering provider: Vanessa Kick  Date of discharge: 05/27/2020  Admitting diagnosis: Indication for care in labor or delivery [O75.9] Spontaneous vaginal delivery [O80] Intrauterine pregnancy: [redacted]w[redacted]d    Secondary diagnosis:  Active Problems:   Indication for care in labor or delivery   Spontaneous vaginal delivery  Additional problems: graves disease    Discharge diagnosis: Term Pregnancy Delivered                                              Post partum procedures:none Augmentation: AROM and Pitocin Complications: None  Hospital course: Induction of Labor With Vaginal Delivery   24y.o. yo [id number] admitted to the hospital 05/25/2020 for induction of labor.  Indication for induction: Elective.  Patient had an uncomplicated labor course as follows: Membrane Rupture Time/Date: 9:24 AM ,05/25/2020   Delivery Method:Vaginal, Spontaneous  Episiotomy: None  Lacerations:  2nd degree;Perineal  Details of delivery can be found in separate delivery note.  Patient had a routine postpartum course. Patient is discharged home 05/27/20.  Newborn Data: Birth date:05/25/2020  Birth time:4:23 PM  Gender:Female  Living status:Living  Apgars:7 ,9  Weight:3935 g   Magnesium Sulfate received: No BMZ received: No Rhophylac:No MMR:No T-DaP:Given prenatally Flu: No Transfusion:No  Physical exam  Vitals:   05/26/20 0915 05/26/20 1422 05/26/20 1952 05/27/20 0638  BP: 120/84 120/89 118/88   Pulse: 92 (!) 102 77   Resp: _0 Temp: 97.6 F (36.4 C) 98.5 F (36.9 C) 98.5 F (36.9 C) 97.9 F (36.6 C)  TempSrc: Oral Oral Oral Oral  SpO2: 100% 99% 100% 100%  Weight:      Height:       G Labs: Lab Results  Component Value Date   WBC 10.1 05/26/2020   HGB 10.6 (L) 05/26/2020   HCT 31.5 (L)  05/26/2020   MCV 92.4 05/26/2020   PLT 171 05/26/2020   CMP Latest Ref Rng & Units 08/11/2018  Glucose 70 - 99 mg/dL 110(H)  BUN 6 - 20 mg/dL 14  Creatinine 0.44 - 1.00 mg/dL 0.39(L)  Sodium 135 - 145 mmol/L 139  Potassium 3.5 - 5.1 mmol/L 4.0  Chloride 98 - 111 mmol/L 106  CO2 22 - 32 mmol/L 24  Calcium 8.9 - 10.3 mg/dL 9.7  Total Protein 6.5 - 8.1 g/dL -  Total Bilirubin 0.3 - 1.2 mg/dL -  Alkaline Phos 38 - 126 U/L -  AST 15 - 41 U/L -  ALT 14 - 54 U/L -   Edinburgh Score: Edinburgh Postnatal Depression Scale Screening Tool 05/26/2020  I have been able to laugh and see the funny side of things. 0  I have looked forward with enjoyment to things. 0  I have blamed myself unnecessarily when things went wrong. 1  I have been anxious or worried for no good reason. 1  I have felt scared or panicky for no good reason. 0  Things have been getting on top of me. 0  I have been so unhappy that I have had difficulty sleeping. 0  I have felt sad or miserable. 0  I have been so unhappy that I have been crying. 0  The thought of harming myself has occurred to me. 0  Edinburgh Postnatal Depression Scale Total 2      After visit meds:  Allergies as of 05/27/2020      Reactions   Almond (diagnostic) Nausea Only      Medication List    STOP taking these medications   acetaminophen 500 MG tablet Commonly known as: TYLENOL   aspirin EC 81 MG tablet   [facility] EVENING PRIMROSE OIL PO   norethindrone 0.35 MG tablet Commonly known as: MICRONOR   propranolol 10 MG tablet Commonly known as: INDERAL     TAKE these medications   cetirizine 10 MG tablet Commonly known as: ZYRTEC Take 10 mg by mouth daily.   PRENATAL PO Take 1 tablet by mouth daily.            Discharge Care Instructions  (From admission, onward)         Start     Ordered   05/27/20 0000  Discharge wound care:       Comments: Sitz baths and icepacks to perineum.  If stitches, they will dissolve.   05/27/20  0721           Discharge home in stable condition Infant Feeding:  ? Infant Disposition:home with mother Discharge instruction: per After Visit Summary and Postpartum booklet. Activity: Advance as tolerated. Pelvic rest for 6 weeks.  Diet: routine diet Anticipated Birth Control: Unsure Postpartum Appointment:4 weeks Additional Postpartum F/U: none Future Appointments:No future appointments. Follow up Visit:  Follow-up Information    Vanessa Kick, MD Follow up in 4 week(s).   Specialty: Obstetrics and Gynecology Contact information: [address] [place] [address] [phone]                   05/27/2020 Daria Pastures, MD

## 2021-04-14 ENCOUNTER — Ambulatory Visit
Admission: RE | Admit: 2021-04-14 | Discharge: 2021-04-14 | Disposition: A | Discharge status: Home / Self Care | Payer: 59 | Source: Ambulatory Visit

## 2021-04-14 ENCOUNTER — Other Ambulatory Visit: Payer: Self-pay

## 2021-04-14 VITALS — BP 110/77 | HR 117 | Temp 98.8°F | Resp 18

## 2021-04-14 DIAGNOSIS — K644 Residual hemorrhoidal skin tags: Secondary | ICD-10-CM

## 2021-04-14 MED ORDER — HYDROCORTISONE ACETATE 25 MG RE SUPP: 25.0000 mg | Freq: Two times a day (BID) | RECTAL | 0 refills | Status: AC

## 2021-04-14 NOTE — ED Notes (Signed)
Provided chaperone with provider during exam.

## 2021-04-14 NOTE — ED Triage Notes (Signed)
Pt presents today with c/o of rectal pain/swelling x 10 days. She denies bleeding.

## 2021-04-14 NOTE — ED Provider Notes (Signed)
CHIEF COMPLAINT:   Chief Complaint  Patient presents with   rectal swelling     SUBJECTIVE/HPI:  HPI A very pleasant 25 y.o.Female presents today with concern for hemorrhoid.  Patient states that approximately 10 days ago she began noticing swelling to her rectal region.  Patient reports the use of witch hazel, Tucks pads, stool softeners and other over-the-counter remedies without relief.  Patient reports that she did have some issues with hemorrhoids after pregnancy.  Patient states that typically she would have internal hemorrhoids with mild protrusion outside of the rectal region, but states that this seems larger and on the outside of her rectum.  Patient does not report any bleeding, but states that the area does feel irritated.   has a past medical history of Chronic abdominal pain, GERD (gastroesophageal reflux disease), Hypertension, Hyperthyroidism, IBS (irritable bowel syndrome), and Spontaneous vaginal delivery (02/17/2018). ROS:  Review of Systems See Subjective/HPI Medications, Allergies and Problem List personally reviewed in Epic today OBJECTIVE:   Vitals:   04/14/21 0957  BP: 110/77  Pulse: (!) 117  Resp: 18  Temp: 98.8 F (37.1 C)  SpO2: 98%    Physical Exam   General: Appears well-developed and well-nourished. No acute distress.  HEENT Head: Normocephalic and atraumatic.  Ears: Hearing grossly intact, no drainage or visible deformity.  Cardiovascular: Normal rate Pulm/Chest: No respiratory distress Neurological: Alert and oriented to person, place, and time.  Skin: Skin is warm and dry.  Rectal: External hemorrhoid approximately 1.5 cm x 0.5 cm externally without TTP or erythema.  No bleeding noted. Psychiatric: Normal mood, affect, behavior, and thought content.   Vital signs and nursing note reviewed.   Patient stable and cooperative with examination.  Rectal exam chaperoned by RN. LABS/X-RAYS/EKG/MEDS:   No results found for any visits on  04/14/21.  MEDICAL DECISION MAKING:   Patient presents with concern for hemorrhoid.  Patient states that approximately 10 days ago she began noticing swelling to her rectal region.  Patient reports the use of witch hazel, Tucks pads, stool softeners and other over-the-counter remedies without relief.  Patient reports that she did have some issues with hemorrhoids after pregnancy.  Patient states that typically she would have internal hemorrhoids with mild protrusion outside of the rectal region, but states that this seems larger and on the outside of her rectum.  Patient does not report any bleeding, but states that the area does feel irritated.  Chart review completed.  Given symptoms along with assessment findings, likely external hemorrhoid.  Hemorrhoid does not appear to be thrombosed, as such I do not feel that the area needs to be drained.  Rx'd Anusol to the patient's preferred pharmacy and advised about home treatment and care as outlined in her AVS.  Advised strict PCP follow-up for worsening of symptoms.  Patient verbalized understanding and agreed with plan.  Stable on discharge.  ASSESSMENT/PLAN:  1. External hemorrhoid  Meds ordered this encounter  Medications   hydrocortisone (ANUSOL-HC) 25 MG suppository    Sig: Place 1 suppository (25 mg total) rectally 2 (two) times daily for 10 days.    Dispense:  20 suppository    Refill:  0    Order Specific Question:   Supervising Provider    Answer:   Merrilee Jansky [id number]   Instructions about new medications and side effects provided.  Plan:   Discharge Instructions      Use medication as directed. If the area becomes more swollen or painful, follow up with your  PCP for further evaluation       A copy of these instructions have been given to the patient or responsible adult who demonstrated the ability to learn, asked appropriate questions, and verbalized understanding of the plan of care.  There were no barriers to  learning identified.   Amalia Greenhouse, FNP-C 04/14/21  This note was partially made with the aid of speech-to-text dictation; typographical errors are not intentional.    Amalia Greenhouse, FNP 04/14/21 1017

## 2021-04-14 NOTE — Discharge Instructions (Addendum)
Use medication as directed. If the area becomes more swollen or painful, follow up with your PCP for further evaluation

## 2021-05-24 ENCOUNTER — Other Ambulatory Visit: Payer: Self-pay

## 2021-05-24 ENCOUNTER — Ambulatory Visit (INDEPENDENT_AMBULATORY_CARE_PROVIDER_SITE_OTHER): Payer: 59 | Admitting: Family Medicine

## 2021-05-24 VITALS — BP 110/78 | HR 88 | Temp 98.5°F | Wt 123.5 lb

## 2021-05-24 DIAGNOSIS — R1013 Epigastric pain: Secondary | ICD-10-CM | POA: Diagnosis not present

## 2021-05-24 LAB — COMPREHENSIVE METABOLIC PANEL
ALT: 9 U/L (ref 0–35)
AST: 13 U/L (ref 0–37)
Albumin: 4.5 g/dL (ref 3.5–5.2)
Alkaline Phosphatase: 73 U/L (ref 39–117)
BUN: 15 mg/dL (ref 6–23)
CO2: 27 mEq/L (ref 19–32)
Calcium: 9.3 mg/dL (ref 8.4–10.5)
Chloride: 106 mEq/L (ref 96–112)
Creatinine, Ser: 0.94 mg/dL (ref 0.40–1.20)
GFR: 84.58 mL/min (ref >60.00)
Glucose, Bld: 79 mg/dL (ref 70–99)
Potassium: 4.4 mEq/L (ref 3.5–5.1)
Sodium: 139 mEq/L (ref 135–145)
Total Bilirubin: 0.6 mg/dL (ref 0.2–1.2)
Total Protein: 7.2 g/dL (ref 6.0–8.3)

## 2021-05-24 LAB — CBC
HCT: 42.1 % (ref 36.0–46.0)
Hemoglobin: 14.5 g/dL (ref 12.0–15.0)
MCHC: 34.4 g/dL (ref 30.0–36.0)
MCV: 89.3 fl (ref 78.0–100.0)
Platelets: 242 10*3/uL (ref 150.0–400.0)
RBC: 4.72 Mil/uL (ref 3.87–5.11)
RDW: 13.5 % (ref 11.5–15.5)
WBC: 3.6 10*3/uL — ABNORMAL LOW (ref 4.0–10.5)

## 2021-05-24 LAB — LIPASE: Lipase: 24 U/L (ref 11.0–59.0)

## 2021-05-24 MED ORDER — FAMOTIDINE 20 MG PO TABS: 20.0000 mg | ORAL_TABLET | Freq: Two times a day (BID) | ORAL | 0 refills | Status: DC

## 2021-05-24 NOTE — Patient Instructions (Signed)
Labs today  Lets try pepcid or Famotidine - safe for breast feeding, may increase supply.   In the future you could do omeprazole but would need to pump and dump for 4 hours after dose and baby should be fine.

## 2021-05-24 NOTE — Assessment & Plan Note (Signed)
Post-prandial. Discussed most likely either gastritis/ulcer or could be gallstones. No specific food triggers and negative murphy's sign today. Discussed blood work (evaluate pancrease given radiating to the back) and liver function for gallstone today. And initiating treatment for possible gastritis/ulcer given persistent reflux symptoms. Start famotidine (as still breastfeeding would like to avoid PPI). If not improving would recommend RUQ [place] in 2 weeks - ordered today so patient can schedule and cancel if symptoms improve.

## 2021-05-24 NOTE — Progress Notes (Signed)
Subjective:     Lynn Henry is a 25 y.o. female presenting for Abdominal Pain (Epigastric area, R flank and radiates into back/shoulder blades, off and on but starts shortly after she eats and lasts a few hours )     Abdominal Pain This is a new problem. The current episode started 1 to 4 weeks ago. The problem occurs 2 to 4 times per day. The most recent episode lasted 2 hours. The problem has been waxing and waning. The pain is located in the epigastric region and right flank. The abdominal pain radiates to the right shoulder, left shoulder and back. Associated symptoms include nausea. Pertinent negatives include no belching, constipation, diarrhea, dysuria, flatus, frequency, hematochezia, hematuria, myalgias, vomiting or weight loss. Associated symptoms comments: heartburn. The pain is aggravated by eating. Relieved by: laying on left side, heating pad. She has tried acetaminophen and antacids (essential oils, heating pad) for the symptoms. The treatment provided no relief. heartburn   Hx of heartburn nightly - previously used omperazole now just tums  Weight loss - but working towards doing that   Review of Systems  Constitutional:  Negative for weight loss.  Gastrointestinal:  Positive for abdominal pain and nausea. Negative for constipation, diarrhea, flatus, hematochezia and vomiting.  Genitourinary:  Negative for dysuria, frequency and hematuria.  Musculoskeletal:  Negative for myalgias.    Social History   Tobacco Use  Smoking Status Former   Packs/day: 0.50   Types: Cigarettes   Quit date: 06/19/2017   Years since quitting: 3.9  Smokeless Tobacco Never  Tobacco Comments   quit when pregnanct        Objective:    BP Readings from Last 3 Encounters:  05/24/21 110/78  04/14/21 110/77  05/26/20 118/88   Wt Readings from Last 3 Encounters:  05/24/21 123 lb 8 oz (56 kg)  05/25/20 168 lb (76.2 kg)  11/19/18 134 lb (60.8 kg)    BP 110/78   Pulse 88    Temp 98.5 F (36.9 C) (Temporal)   Wt 123 lb 8 oz (56 kg)   LMP 04/29/2021 (Exact Date)   SpO2 95%   Breastfeeding Yes   BMI 20.55 kg/m    Physical Exam Constitutional:      General: She is not in acute distress.    Appearance: She is well-developed. She is not diaphoretic.  HENT:     Head: Normocephalic and atraumatic.     Right Ear: External ear normal.     Left Ear: External ear normal.     Nose: Nose normal.  Eyes:     Conjunctiva/sclera: Conjunctivae normal.  Cardiovascular:     Rate and Rhythm: Normal rate and regular rhythm.     Heart sounds: No murmur heard. Pulmonary:     Effort: Pulmonary effort is normal. No respiratory distress.     Breath sounds: Normal breath sounds. No wheezing.  Abdominal:     General: Abdomen is flat. Bowel sounds are normal.     Palpations: Abdomen is soft.     Tenderness: There is abdominal tenderness in the epigastric area. There is no right CVA tenderness, left CVA tenderness, guarding or rebound. Negative signs include Murphy's sign.  Musculoskeletal:     Cervical back: Neck supple.  Skin:    General: Skin is warm and dry.     Capillary Refill: Capillary refill takes less than 2 seconds.  Neurological:     Mental Status: She is alert. Mental status is at baseline.  Psychiatric:  Mood and Affect: Mood normal.        Behavior: Behavior normal.          Assessment & Plan:   Problem List Items Addressed This Visit       Other   Epigastric abdominal pain - Primary    Post-prandial. Discussed most likely either gastritis/ulcer or could be gallstones. No specific food triggers and negative murphy's sign today. Discussed blood work (evaluate pancrease given radiating to the back) and liver function for gallstone today. And initiating treatment for possible gastritis/ulcer given persistent reflux symptoms. Start famotidine (as still breastfeeding would like to avoid PPI). If not improving would recommend RUQ [place] in 2 weeks -  ordered today so patient can schedule and cancel if symptoms improve.       Relevant Medications   famotidine (PEPCID) 20 MG tablet   Other Relevant Orders   Comprehensive metabolic panel   CBC   Lipase   US ABDOMEN LIMITED RUQ (LIVER/GB)     Return if symptoms worsen or fail to improve.  Lynnda Child, MD  This visit occurred during the SARS-CoV-2 public health emergency.  Safety protocols were in place, including screening questions prior to the visit, additional usage of staff PPE, and extensive cleaning of exam room while observing appropriate contact time as indicated for disinfecting solutions.

## 2021-06-05 ENCOUNTER — Ambulatory Visit: Payer: 59

## 2021-06-06 ENCOUNTER — Ambulatory Visit
Admission: RE | Admit: 2021-06-06 | Discharge: 2021-06-06 | Disposition: A | Discharge status: Home / Self Care | Payer: 59 | Source: Ambulatory Visit | Attending: Family Medicine | Admitting: Family Medicine

## 2021-06-06 ENCOUNTER — Other Ambulatory Visit: Payer: Self-pay

## 2021-06-06 DIAGNOSIS — R1013 Epigastric pain: Secondary | ICD-10-CM | POA: Diagnosis not present

## 2021-06-07 ENCOUNTER — Encounter: Payer: Self-pay | Admitting: Family Medicine

## 2021-06-15 ENCOUNTER — Other Ambulatory Visit: Payer: Self-pay | Admitting: Family Medicine

## 2021-06-15 DIAGNOSIS — R1013 Epigastric pain: Secondary | ICD-10-CM

## 2021-07-04 ENCOUNTER — Other Ambulatory Visit: Payer: Self-pay | Admitting: Family Medicine

## 2021-07-04 DIAGNOSIS — R1013 Epigastric pain: Secondary | ICD-10-CM

## 2021-10-04 DIAGNOSIS — E0590 Thyrotoxicosis, unspecified without thyrotoxic crisis or storm: Secondary | ICD-10-CM | POA: Diagnosis not present

## 2021-11-30 ENCOUNTER — Encounter: Payer: Self-pay | Admitting: Adult Health

## 2021-11-30 ENCOUNTER — Other Ambulatory Visit: Payer: Self-pay

## 2021-11-30 ENCOUNTER — Telehealth: Payer: Self-pay

## 2021-11-30 ENCOUNTER — Ambulatory Visit (INDEPENDENT_AMBULATORY_CARE_PROVIDER_SITE_OTHER): Payer: 59 | Admitting: Adult Health

## 2021-11-30 VITALS — BP 122/80 | HR 101 | Temp 97.9°F | Ht 65.0 in | Wt 135.0 lb

## 2021-11-30 DIAGNOSIS — J040 Acute laryngitis: Secondary | ICD-10-CM

## 2021-11-30 DIAGNOSIS — N912 Amenorrhea, unspecified: Secondary | ICD-10-CM | POA: Diagnosis not present

## 2021-11-30 DIAGNOSIS — R051 Acute cough: Secondary | ICD-10-CM

## 2021-11-30 LAB — HCG, QUANTITATIVE, PREGNANCY: Quantitative HCG: <0.6 m[IU]/mL

## 2021-11-30 LAB — CBC WITH DIFFERENTIAL/PLATELET
Basophils Absolute: 0 10*3/uL (ref 0.0–0.1)
Basophils Relative: 0.3 % (ref 0.0–3.0)
Eosinophils Absolute: 0.5 10*3/uL (ref 0.0–0.7)
Eosinophils Relative: 6.5 % — ABNORMAL HIGH (ref 0.0–5.0)
HCT: 41.5 % (ref 36.0–46.0)
Hemoglobin: 14.4 g/dL (ref 12.0–15.0)
Lymphocytes Relative: 19.9 % (ref 12.0–46.0)
Lymphs Abs: 1.5 10*3/uL (ref 0.7–4.0)
MCHC: 34.7 g/dL (ref 30.0–36.0)
MCV: 90 fl (ref 78.0–100.0)
Monocytes Absolute: 0.4 10*3/uL (ref 0.1–1.0)
Monocytes Relative: 5 % (ref 3.0–12.0)
Neutro Abs: 5.1 10*3/uL (ref 1.4–7.7)
Neutrophils Relative %: 68.3 % (ref 43.0–77.0)
Platelets: 284 10*3/uL (ref 150.0–400.0)
RBC: 4.61 Mil/uL (ref 3.87–5.11)
RDW: 13.1 % (ref 11.5–15.5)
WBC: 7.4 10*3/uL (ref 4.0–10.5)

## 2021-11-30 MED ORDER — PREDNISONE 10 MG (21) PO TBPK: ORAL_TABLET | ORAL | 0 refills | Status: DC

## 2021-11-30 NOTE — Progress Notes (Addendum)
Acute Office Visit  Subjective:    Patient ID: Lynn Henry, female    DOB: June 11, 1996, 26 y.o.   MRN: [id number]  Chief Complaint  Patient presents with   Follow-up    Pt c/o cough, sore throat, runny nose since Tuesday. Now has lost her voice. Pt states her children were sick, they were tested for strep, covid, etc - negative. Pediatrician dx as viral.     HPI Patient is in today for laryngitis symptoms, she has had sick children with ear infections. Kids were tested for strep and RSV negative.  She started with symptoms on Tuesday 11/28/2021.  She reports sore throat yesterday. She has post nasal drainage.  Painful swallowing. Mild cough post nasal drip she reports. She is on Zyrtec.   Her entire family also had stomach viral illness last Monday. Diarrhea and vomiting.   No LMP recorded. (Menstrual status: Oral contraceptives). LMP - last 10/21/2021 she is 2 weeks late, has taken pregnancy test at home and negative. She has had regular menses for the previous 9 months before January. She is still breastfeeding.  TSH last 8 followed by Dr. Gershon Crane endocrinology.   Denies any breast concerns for mastitis.  Denies dizziness, lightheadedness, pre syncopal or syncopal episodes.    Patient  denies any fever, body aches,chills, rash, chest pain, shortness of breath, nausea, vomiting, or diarrhea.    Past Medical History:  Diagnosis Date   Chronic abdominal pain    presumed IBS   GERD (gastroesophageal reflux disease)    Hypertension    gestational HTN towards end of pregnancy   Hyperthyroidism    IBS (irritable bowel syndrome)    Spontaneous vaginal delivery 02/17/2018    Past Surgical History:  Procedure Laterality Date   TOOTH EXTRACTION      Family History  Problem Relation Age of Onset   Hyperlipidemia Mother    Hypertension Mother    Prostate cancer Maternal Grandfather    Hyperlipidemia Maternal Grandfather    Heart disease Maternal Grandfather    Stroke  Maternal Grandfather    Hypertension Maternal Grandfather    Arthritis Paternal Grandmother    Prostate cancer Paternal Grandfather    Diabetes Paternal Grandfather     Social History   Socioeconomic History   Marital status: Married    Spouse name: Tanner   Number of children: 1   Years of education: 12   Highest education level: High school graduate  Occupational History   Not on file  Tobacco Use   Smoking status: Former    Packs/day: 0.50    Types: Cigarettes    Quit date: 06/19/2017    Years since quitting: 4.4   Smokeless tobacco: Never   Tobacco comments:    quit when pregnanct  Substance and Sexual Activity   Alcohol use: No    Alcohol/week: 0.0 standard drinks   Drug use: No   Sexual activity: Yes    Birth control/protection: None, Pill  Other Topics Concern   Not on file  Social History Narrative   Not on file   Social Determinants of Health   Financial Resource Strain: Not on file  Food Insecurity: Not on file  Transportation Needs: Not on file  Physical Activity: Not on file  Stress: Not on file  Social Connections: Not on file  Intimate Partner Violence: Not on file    Outpatient Medications Prior to Visit  Medication Sig Dispense Refill   cetirizine (ZYRTEC) 10 MG tablet Take 10 mg by  mouth daily.     levocetirizine (XYZAL) 5 MG tablet levocetirizine 5 mg tablet  TAKE 1 TABLET BY MOUTH EVERY DAY IN THE EVENING FOR 30 DAYS     methimazole (TAPAZOLE) 10 MG tablet methimazole 10 mg tablet  TAKE 1 TABLET BY MOUTH TWICE A DAY     norethindrone (MICRONOR) 0.35 MG tablet Take 1 tablet by mouth daily.     famotidine (PEPCID) 20 MG tablet TAKE 1 TABLET BY MOUTH TWICE A DAY 60 tablet 0   Prenatal Vit-Fe Fumarate-FA (PRENATAL PO) Take 1 tablet by mouth daily.     No facility-administered medications prior to visit.    Allergies  Allergen Reactions   Almond (Diagnostic) Nausea Only    Review of Systems  Constitutional:  Positive for fatigue.  Negative for activity change, appetite change, chills, diaphoresis, fever and unexpected weight change.  HENT:  Positive for congestion, postnasal drip, rhinorrhea and sore throat. Negative for ear discharge, ear pain and trouble swallowing.   Respiratory: Negative.    Cardiovascular: Negative.   Gastrointestinal: Negative.   Genitourinary:  Positive for menstrual problem. Negative for difficulty urinating, dyspareunia, dysuria, enuresis, flank pain, frequency, genital sores, hematuria and pelvic pain.  Musculoskeletal: Negative.   Skin:  Negative for rash.      Objective:    Physical Exam Constitutional:      General: She is not in acute distress.    Appearance: Normal appearance. She is obese. She is not ill-appearing, toxic-appearing or diaphoretic.  HENT:     Head: Normocephalic and atraumatic.     Jaw: There is normal jaw occlusion.     Salivary Glands: Right salivary gland is not diffusely enlarged or tender. Left salivary gland is not diffusely enlarged or tender.     Right Ear: External ear normal. A middle ear effusion is present. Tympanic membrane is not perforated or erythematous.     Left Ear: External ear normal. A middle ear effusion (clear fluid bilaterally) is present. Tympanic membrane is not perforated or erythematous.     Nose: Congestion present. No rhinorrhea.     Right Turbinates: Not swollen.     Left Turbinates: Not swollen.     Right Sinus: No maxillary sinus tenderness or frontal sinus tenderness.     Left Sinus: No maxillary sinus tenderness or frontal sinus tenderness.     Mouth/Throat:     Mouth: Mucous membranes are moist.     Tongue: No lesions.     Pharynx: Uvula midline. Posterior oropharyngeal erythema present. No pharyngeal swelling or oropharyngeal exudate.     Tonsils: No tonsillar exudate or tonsillar abscesses. 0 on the right. 0 on the left.  Eyes:     Extraocular Movements: Extraocular movements intact.  Cardiovascular:     Rate and Rhythm:  Normal rate and regular rhythm.  Pulmonary:     Effort: Pulmonary effort is normal. No respiratory distress.     Breath sounds: Normal breath sounds. No stridor. No wheezing, rhonchi or rales.  Chest:     Chest wall: No tenderness.  Abdominal:     General: Bowel sounds are normal. There is no distension.     Palpations: Abdomen is soft.     Tenderness: There is no abdominal tenderness.  Musculoskeletal:        General: Normal range of motion.  Skin:    Findings: No erythema or rash.  Neurological:     Mental Status: She is alert and oriented to person, place, and time.  Motor: No weakness.     Gait: Gait normal.  Psychiatric:        Mood and Affect: Mood normal.        Behavior: Behavior normal.        Thought Content: Thought content normal.        Judgment: Judgment normal.    BP 122/80 (BP Location: Left Arm, Patient Position: Sitting, Cuff Size: Small)    Pulse (!) 101    Temp 97.9 F (36.6 C) (Oral)    Ht  (1.651 m)    Wt 135 lb (61.2 kg)    SpO2 99%    BMI 22.47 kg/m  Wt Readings from Last 3 Encounters:  11/30/21 135 lb (61.2 kg)  05/24/21 123 lb 8 oz (56 kg)  05/25/20 168 lb (76.2 kg)    Health Maintenance Due  Topic Date Due   PAP-Cervical Cytology Screening  Never done   PAP SMEAR-Modifier  Never done    There are no preventive care reminders to display for this patient.    Lab Results  Component Value Date   TSH <0.01 (L) 07/18/2018   Lab Results  Component Value Date   WBC 3.6 (L) 05/24/2021   HGB 14.5 05/24/2021   HCT 42.1 05/24/2021   MCV 89.3 05/24/2021   PLT 242.0 05/24/2021   Lab Results  Component Value Date   NA 139 05/24/2021   K 4.4 05/24/2021   CO2 27 05/24/2021   GLUCOSE 79 05/24/2021   BUN 15 05/24/2021   CREATININE 0.94 05/24/2021   BILITOT 0.6 05/24/2021   ALKPHOS 73 05/24/2021   AST 13 05/24/2021   ALT 9 05/24/2021   PROT 7.2 05/24/2021   ALBUMIN 4.5 05/24/2021   CALCIUM 9.3 05/24/2021   ANIONGAP 9 08/11/2018    GFR 84.58 05/24/2021   No results found for: CHOL No results found for: HDL No results found for: LDLCALC No results found for: TRIG No results found for: CHOLHDL No results found for: QMVH8I     Assessment & Plan:   Problem List Items Addressed This Visit       Respiratory   Laryngitis - Primary   Relevant Orders   CBC with Differential/Platelet   hCG, quantitative, pregnancy   Culture, Group A Strep   COVID-19, Flu A+B and RSV     Other   Care and examination of lactating mother   Amenorrhea   Relevant Orders   hCG, quantitative, pregnancy   Culture, Group A Strep   COVID-19, Flu A+B and RSV   Acute cough   Relevant Orders   Culture, Group A Strep   COVID-19, Flu A+B and RSV     Meds ordered this encounter  Medications   predniSONE (STERAPRED UNI-PAK 21 TAB) 10 MG (21) TBPK tablet    Sig: PO: Take 6 tablets on day 1:Take 5 tablets day 2:Take 4 tablets day 3: Take 3 tablets day 4:Take 2 tablets day five: Take 1 tablet day 6    Dispense:  21 tablet    Refill:  0   Patient request prednisone for sore throat/ congestion. Must confirm no pregnancy, will do HCG blood- she will hold medication until confirmation of negative HCG. If amenorrhea continues she is scheduling with her gynecologist she reports as well. TSH is not controlled, and she is followed by Dr. Gershon Crane, schedule a follow up with endocrinology advised.  Attempting to add recheck TSH to labs.  COVID,FLU , RSV, and strep test sent off.  Afebrile, day 3 of symptoms suspect viral URI/ Laryngitis. Rest,fluids, and return if any symptoms worsening.   Return if symptoms worsen or fail to improve, for Go to Emergency room/ urgent care if worse, at any time for any worsening symptoms.   Jairo BenMichelle Smith Channie Bostick, FNP

## 2021-11-30 NOTE — Progress Notes (Signed)
Forwarded to [place] ?

## 2021-11-30 NOTE — Telephone Encounter (Signed)
Mchart msg sent

## 2021-11-30 NOTE — Progress Notes (Signed)
Negative HCG pregnancy test.  ?See when she is seeing Dr. Honor Junes endocrinology again for [employer] the amenorrhea could be from this.  ?Consult with her gynecologist advised as well.  ? ?Eosinophils elevated, maybe allergy response, continue allergy mediations, take prednisone, do advise to follow up with Leone Haven, MD for recheck CBC in one month.

## 2021-11-30 NOTE — Patient Instructions (Signed)
Hold prednisone until pregnancy blood test returns negative. ? ?Laryngitis ?Laryngitis is inflammation of the vocal cords that causes symptoms such as hoarseness or loss of voice. The vocal cords are two bands of muscles in your throat. When you speak, these cords come together and vibrate. The vibrations come out through your mouth as sound. When your vocal cords are inflamed, your voice sounds different. ?Laryngitis can be temporary (acute) or long-term (chronic). Most cases of acute laryngitis improve with time. Chronic laryngitis is laryngitis that lasts for more than 3 weeks. ?What are the causes? ?Acute laryngitis may be caused by: ?A viral infection. ?Lots of talking, yelling, or singing. This is also called vocal strain. ?A bacterial infection. ?Chronic laryngitis may be caused by: ?Vocal strain or an injury to the vocal cords. ?Acid reflux (gastroesophageal reflux disease, or GERD). ?Allergies, a sinus infection, or postnasal drip. ?Smoking. ?Excessive alcohol use. ?Breathing in chemicals or dust. ?Growths on the vocal cords. ?What increases the risk? ?The following factors may make you more likely to develop this condition: ?Smoking. ?Alcohol abuse. ?Having allergies. ?Chronic irritants in the workplace, such as toxic fumes. ?What are the signs or symptoms? ?Symptoms of this condition may include: ?Low, hoarse voice. ?Loss of voice. ?Dry cough. ?Sore or dry throat. ?Stuffy or congested nose. ?How is this diagnosed? ?This condition may be diagnosed based on: ?Your symptoms and a physical exam. ?Throat culture. ?Blood test. ?A procedure in which your health care provider looks at your vocal cords with a mirror or viewing tube (laryngoscopy). ?How is this treated? ?Treatment for laryngitis depends on what is causing it. Usually, treatment involves resting your voice and using medicines to soothe your throat. ?If your laryngitis is caused by a bacterial infection, you may need to take antibiotic medicine. ?If  your laryngitis is caused by a growth, you may need to have a procedure to remove it. ?Follow these instructions at home: ?Medicines ?Take over-the-counter and prescription medicines only as told by your health care provider. ?If you were prescribed an antibiotic medicine, take it as told by your health care provider. Do not stop taking the antibiotic even if you start to feel better. ?Use throat lozenges or sprays to soothe your throat as told by your health care provider. ?General instructions ? ?Talk as little as possible. To do this: ?Write instead of talking. Do this until your voice is back to normal. ?Avoid whispering, which can cause vocal strain. ?Gargle with a mixture of salt and water 3-4 times a day or as needed. To make salt water, completely dissolve ?-1 tsp (3-6 g) of salt in 1 cup (237 mL) of warm water. ?Drink enough fluid to keep your urine pale yellow. ?Breathe in moist air. Use a humidifier if you live in a dry climate. ?Do not use any products that contain nicotine or tobacco. These products include cigarettes, chewing tobacco, and vaping devices, such as e-cigarettes. If you need help quitting, ask your health care provider. ?Contact a health care provider if: ?You have a fever. ?You have increasing pain. ?Your symptoms do not get better in 2 weeks. ?Get help right away if: ?You cough up blood. ?You have difficulty swallowing. ?You have trouble breathing. ?Summary ?Laryngitis is inflammation of the vocal cords that causes symptoms such as hoarseness or loss of voice. ?Laryngitis can be temporary or long-term. ?Treatment for laryngitis depends on the cause. It often involves resting your voice and using medicine to soothe your throat. ?Get help right away if you have  difficulty swallowing or breathing or if you cough up blood. ?This information is not intended to replace advice given to you by your health care provider. Make sure you discuss any questions you have with your health care  provider. ?Document Revised: 11/28/2020 Document Reviewed: 11/28/2020 ?Elsevier Patient Education ? [place]. ? ?

## 2021-12-03 LAB — CULTURE, GROUP A STREP

## 2021-12-03 LAB — COVID-19, FLU A+B AND RSV
Influenza A, NAA: NOT DETECTED
Influenza B, NAA: NOT DETECTED
RSV, NAA: NOT DETECTED
SARS-CoV-2, NAA: NOT DETECTED

## 2021-12-04 ENCOUNTER — Other Ambulatory Visit: Payer: Self-pay | Admitting: Adult Health

## 2021-12-04 ENCOUNTER — Encounter: Payer: Self-pay | Admitting: Adult Health

## 2021-12-04 DIAGNOSIS — J020 Streptococcal pharyngitis: Secondary | ICD-10-CM

## 2021-12-04 MED ORDER — AMOXICILLIN 500 MG PO CAPS: 500.0000 mg | ORAL_CAPSULE | Freq: Two times a day (BID) | ORAL | 0 refills | Status: AC

## 2021-12-04 NOTE — Progress Notes (Signed)
No orders of the defined types were placed in this encounter. ?  ?Meds ordered this encounter  ?Medications  ? amoxicillin (AMOXIL) 500 MG capsule  ?  Sig: Take 1 capsule (500 mg total) by mouth 2 (two) times daily for 10 days.  ?  Dispense:  20 capsule  ?  Refill:  0  ?  ?

## 2021-12-04 NOTE — Progress Notes (Signed)
Strep A negative, does have group B strep likely colonized however given her symptoms of pharyngitis will send in Amoxicillin to take, ok to use with breastfeeding.

## 2022-01-03 DIAGNOSIS — E0590 Thyrotoxicosis, unspecified without thyrotoxic crisis or storm: Secondary | ICD-10-CM | POA: Diagnosis not present

## 2022-01-10 DIAGNOSIS — R000 Tachycardia, unspecified: Secondary | ICD-10-CM | POA: Diagnosis not present

## 2022-01-10 DIAGNOSIS — E0590 Thyrotoxicosis, unspecified without thyrotoxic crisis or storm: Secondary | ICD-10-CM | POA: Diagnosis not present

## 2022-04-10 DIAGNOSIS — E0590 Thyrotoxicosis, unspecified without thyrotoxic crisis or storm: Secondary | ICD-10-CM | POA: Diagnosis not present

## 2022-04-11 IMAGING — US US ABDOMEN LIMITED
1 series · 14 of 25 positions shown · non-contrast
Comparison: None.

CLINICAL DATA: Epigastric pain times 3-4 weeks.

EXAM:
ULTRASOUND ABDOMEN LIMITED RIGHT UPPER QUADRANT

[Series 1: us abdomen limited · 0.17mm/px · 14 of 28 slices shown]
[im 1/28]
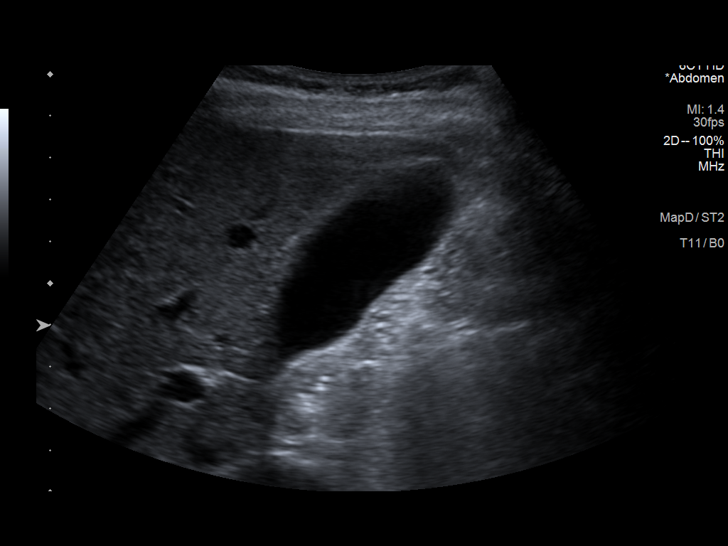
[im 3/28]
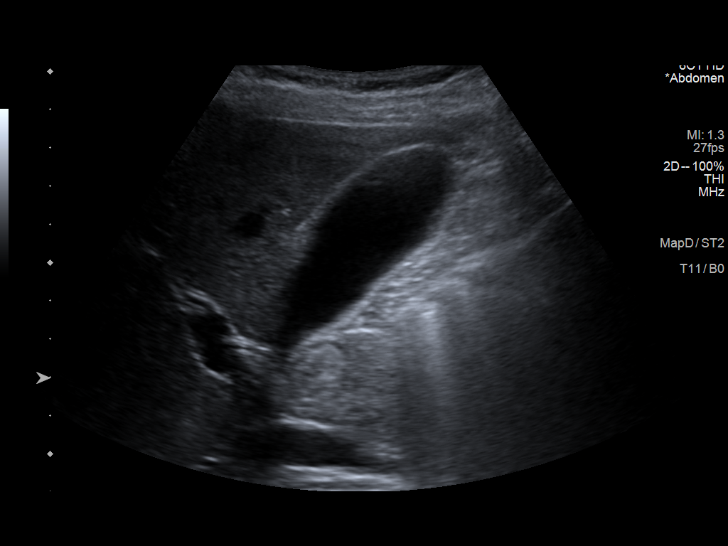
[im 5/28]
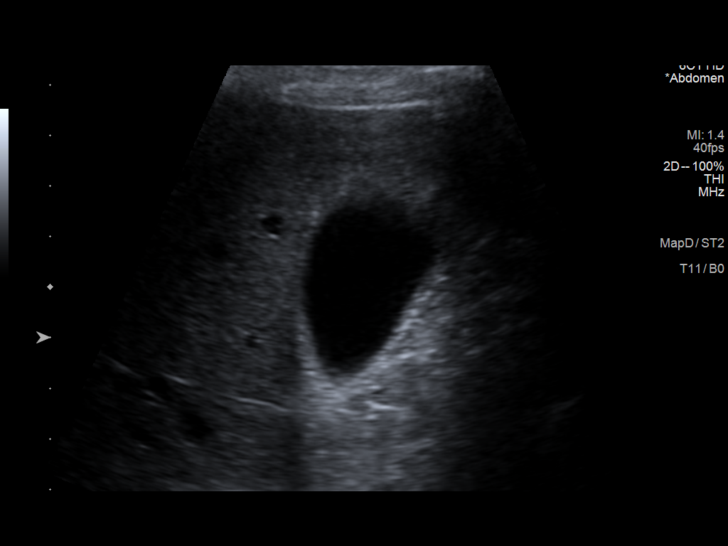
[im 7/28]
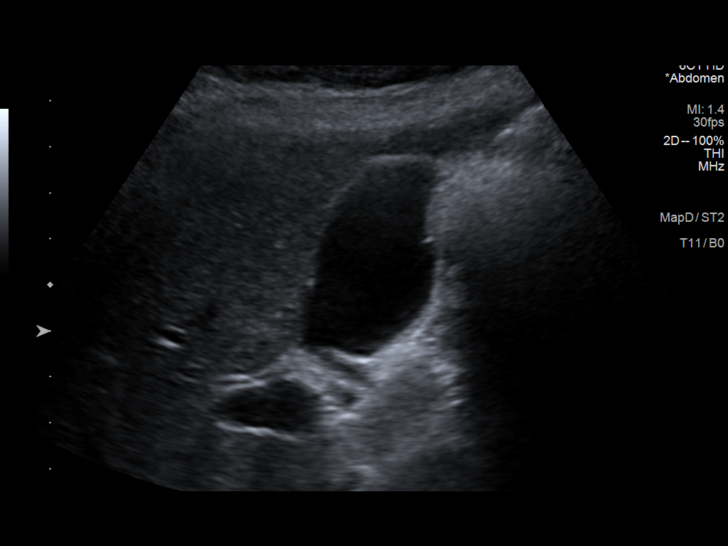
[im 10/28]
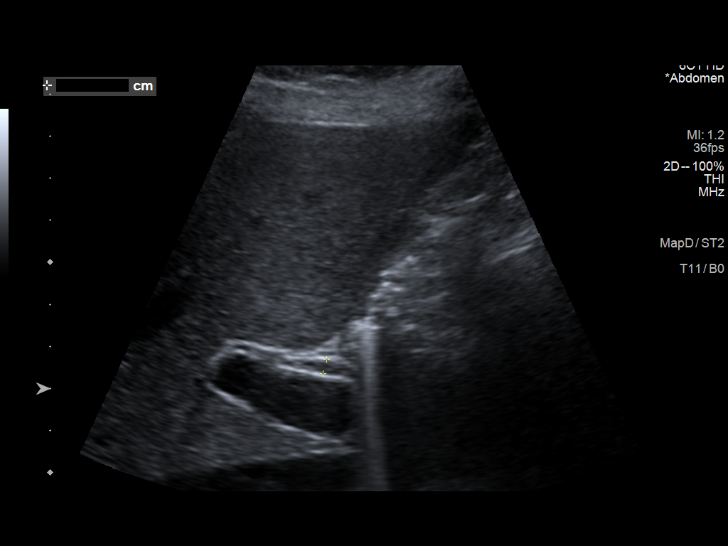
[im 11/28]
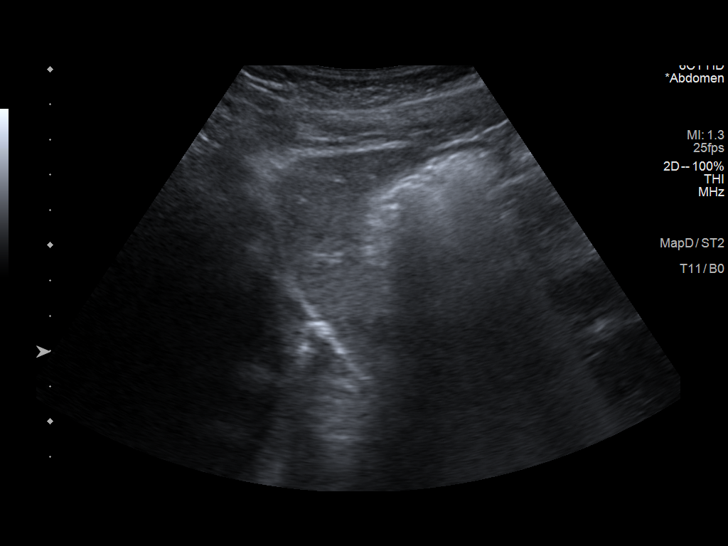
[im 13/28]
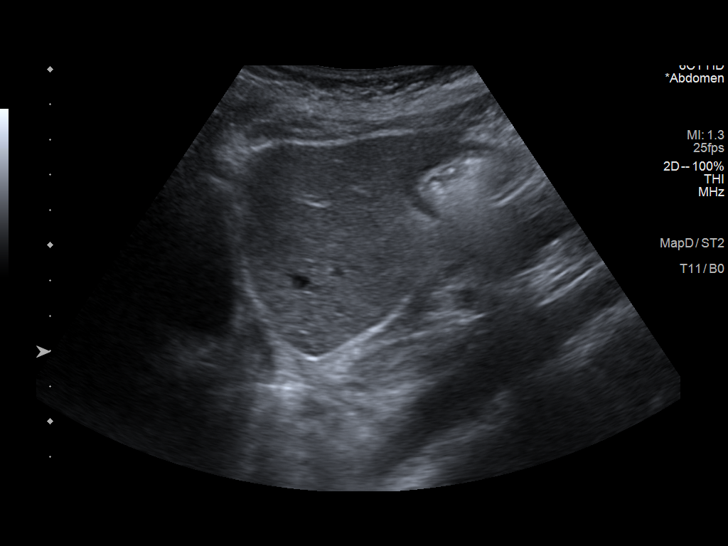
[im 15/28]
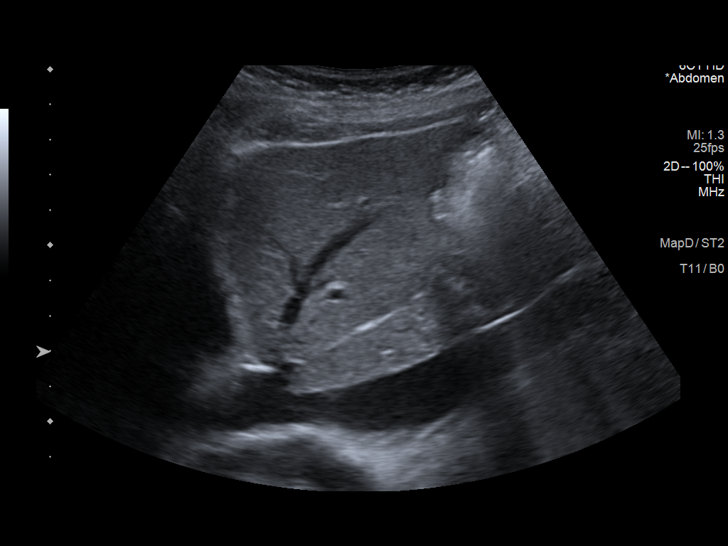
[im 17/28]
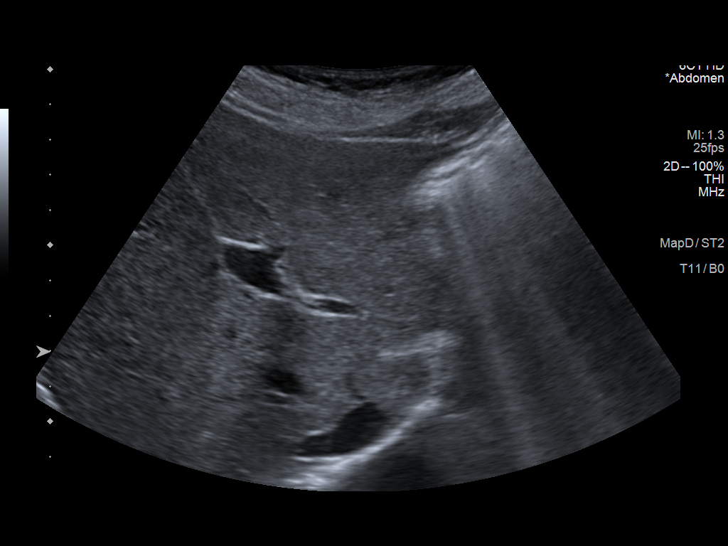
[im 19/28]
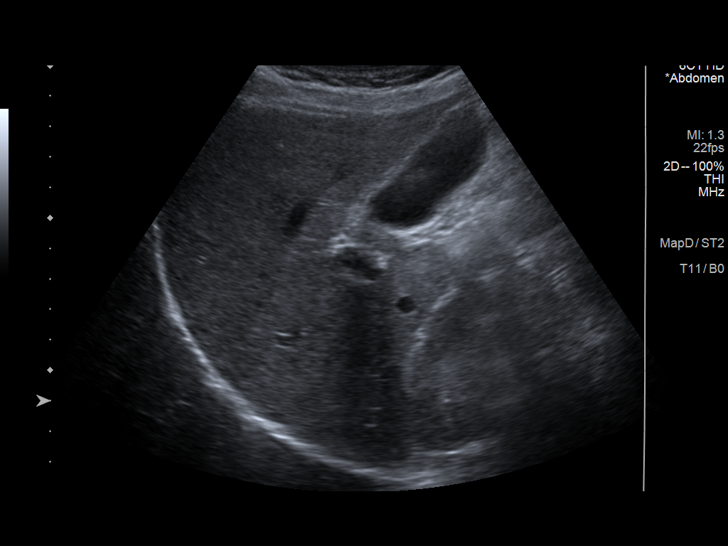
[im 21/28]
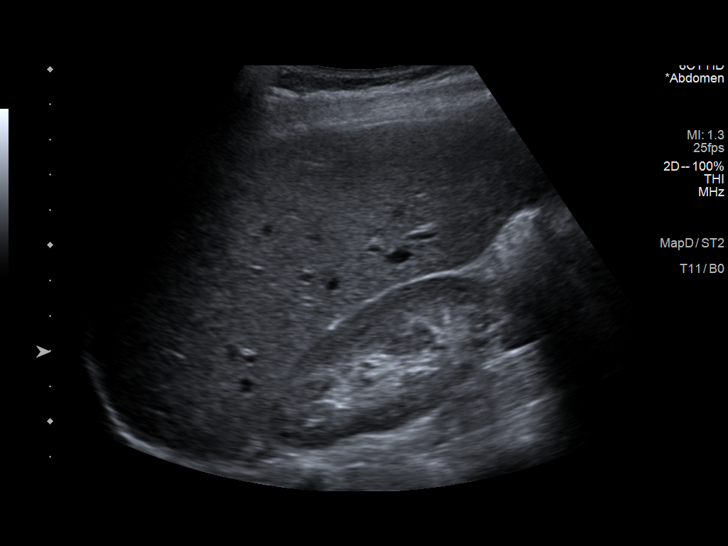
[im 23/28]
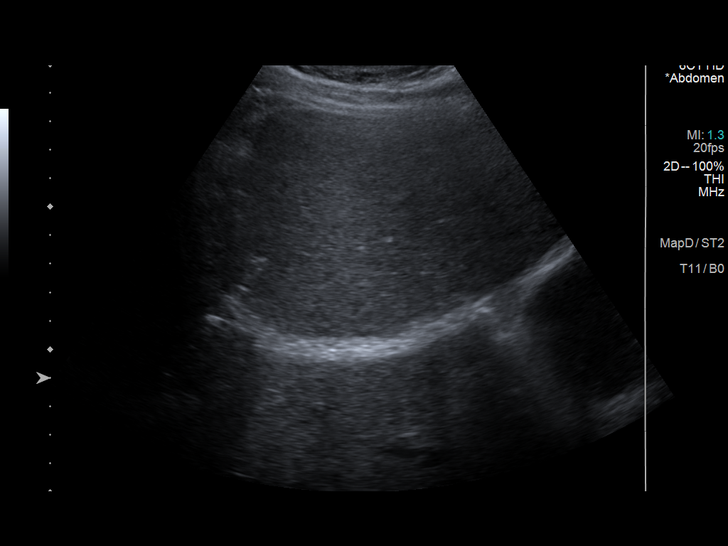
[im 25/28]
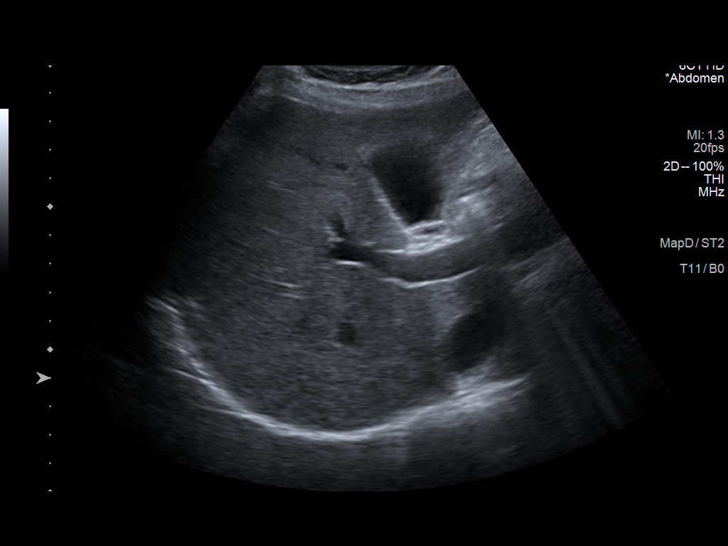
[im 28/28]
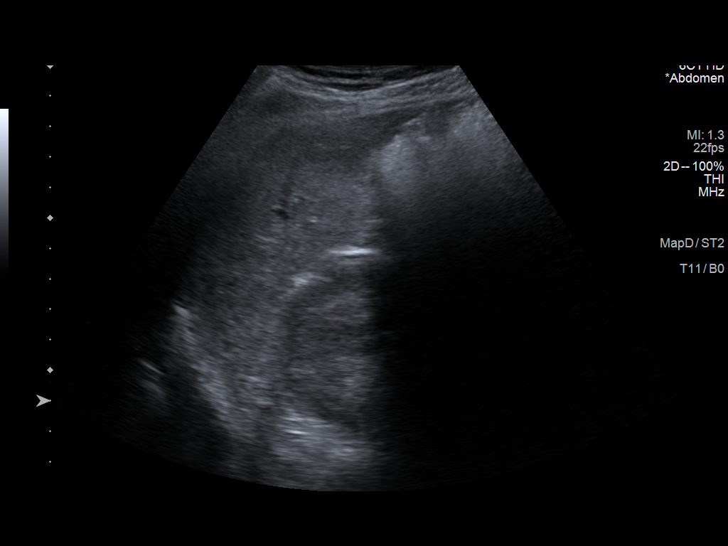

[14 of 25 positions shown; findings below may reference images not displayed]

FINDINGS: Gallbladder:

No gallstones or wall thickening visualized (2.3 mm). No sonographic
Murphy sign noted by sonographer.

Common bile duct:

Diameter: 3.5 mm

Liver:

No focal lesion identified. Within normal limits in parenchymal
echogenicity. Portal vein is patent on color Doppler imaging with
normal direction of blood flow towards the liver.

Other: None.
IMPRESSION: Normal right upper quadrant ultrasound.

## 2022-05-04 DIAGNOSIS — J301 Allergic rhinitis due to pollen: Secondary | ICD-10-CM | POA: Diagnosis not present

## 2022-05-04 DIAGNOSIS — J3081 Allergic rhinitis due to animal (cat) (dog) hair and dander: Secondary | ICD-10-CM | POA: Diagnosis not present

## 2022-05-04 DIAGNOSIS — J3089 Other allergic rhinitis: Secondary | ICD-10-CM | POA: Diagnosis not present

## 2022-05-04 DIAGNOSIS — H1045 Other chronic allergic conjunctivitis: Secondary | ICD-10-CM | POA: Diagnosis not present

## 2022-07-09 DIAGNOSIS — Z124 Encounter for screening for malignant neoplasm of cervix: Secondary | ICD-10-CM | POA: Diagnosis not present

## 2022-07-09 DIAGNOSIS — E0590 Thyrotoxicosis, unspecified without thyrotoxic crisis or storm: Secondary | ICD-10-CM | POA: Diagnosis not present

## 2022-07-11 ENCOUNTER — Encounter: Payer: Self-pay | Admitting: Family Medicine

## 2022-07-11 ENCOUNTER — Ambulatory Visit (INDEPENDENT_AMBULATORY_CARE_PROVIDER_SITE_OTHER): Payer: 59 | Admitting: Family Medicine

## 2022-07-11 DIAGNOSIS — J0140 Acute pansinusitis, unspecified: Secondary | ICD-10-CM

## 2022-07-11 DIAGNOSIS — J329 Chronic sinusitis, unspecified: Secondary | ICD-10-CM | POA: Insufficient documentation

## 2022-07-11 DIAGNOSIS — G930 Cerebral cysts: Secondary | ICD-10-CM | POA: Insufficient documentation

## 2022-07-11 LAB — POC COVID19 BINAXNOW: SARS Coronavirus 2 Ag: NEGATIVE

## 2022-07-11 MED ORDER — BENZONATATE 200 MG PO CAPS: 200.0000 mg | ORAL_CAPSULE | Freq: Two times a day (BID) | ORAL | 0 refills | Status: DC | PRN

## 2022-07-11 MED ORDER — AMOXICILLIN-POT CLAVULANATE 875-125 MG PO TABS: 1.0000 | ORAL_TABLET | Freq: Two times a day (BID) | ORAL | 0 refills | Status: DC

## 2022-07-11 NOTE — Assessment & Plan Note (Addendum)
Symptoms are consistent with sinusitis.  Given duration of symptoms we will proceed with antibiotic treatment.  We will start on Augmentin 1 tablet twice daily for 7 days.  If not improving she will let us know.  Discussed risk of diarrhea with this.  If she has excessive diarrhea she will let us know.  Advised if she develops yeast infection symptoms she contact us for Diflucan. Tessalon sent in for cough.

## 2022-07-11 NOTE — Patient Instructions (Signed)
Nice to see you. We are going to treat you with Augmentin for sinus infection. If you are not starting to improve in the next 4 to 5 days please let us know.  If you notice any worsening symptoms please let us know. If you develop excessive diarrhea or yeast infection symptoms please contact us.

## 2022-07-11 NOTE — Progress Notes (Signed)
Marikay Alar, MD Phone: [phone]  Lynn Henry is a 26 y.o. female who presents today for same day visit.   Respiratory illness: started 7 days ago, not improving, kids were sick and had negative covid tests  Cough- yes  Congestion- yes   Sinus- significant, hurts teeth   Chest- no  Post nasal drip- yes  Sore throat- yes, initially  Shortness of breath- no  Fever-101 F though has resolved  Taste disturbance- no  Smell disturbance- no  Covid exposure- no  Covid vaccination- no  Covid booster- no  Medications- numerous OTC options   Social History   Tobacco Use  Smoking Status Former   Packs/day: 0.50   Types: Cigarettes   Quit date: 06/19/2017   Years since quitting: 5.0  Smokeless Tobacco Never  Tobacco Comments   quit when pregnanct    Current Outpatient Medications on File Prior to Visit  Medication Sig Dispense Refill   cetirizine (ZYRTEC) 10 MG tablet Take 10 mg by mouth daily.     norethindrone (MICRONOR) 0.35 MG tablet Take 1 tablet by mouth daily.     levocetirizine (XYZAL) 5 MG tablet levocetirizine 5 mg tablet  TAKE 1 TABLET BY MOUTH EVERY DAY IN THE EVENING FOR 30 DAYS (Patient not taking: Reported on 07/11/2022)     methimazole (TAPAZOLE) 10 MG tablet methimazole 10 mg tablet  TAKE 1 TABLET BY MOUTH TWICE A DAY (Patient not taking: Reported on 07/11/2022)     predniSONE (STERAPRED UNI-PAK 21 TAB) 10 MG (21) TBPK tablet PO: Take 6 tablets on day 1:Take 5 tablets day 2:Take 4 tablets day 3: Take 3 tablets day 4:Take 2 tablets day five: Take 1 tablet day 6 (Patient not taking: Reported on 07/11/2022) 21 tablet 0   No current facility-administered medications on file prior to visit.     ROS see history of present illness  Objective  Physical Exam Vitals:   07/11/22 0840  BP: 110/70  Pulse: (!) 101  Temp: 99 F (37.2 C)  SpO2: 99%    BP Readings from Last 3 Encounters:  07/11/22 110/70  11/30/21 122/80  05/24/21 110/78   Wt  Readings from Last 3 Encounters:  07/11/22 141 lb 3.2 oz (64 kg)  11/30/21 135 lb (61.2 kg)  05/24/21 123 lb 8 oz (56 kg)    Physical Exam Constitutional:      General: She is not in acute distress.    Appearance: She is not diaphoretic.  HENT:     Head: Normocephalic and atraumatic.     Comments: Bilateral maxillary and frontal sinuses tender to percussion    Mouth/Throat:     Mouth: Mucous membranes are moist.     Pharynx: Oropharynx is clear.  Cardiovascular:     Rate and Rhythm: Normal rate and regular rhythm.     Heart sounds: Normal heart sounds.  Pulmonary:     Effort: Pulmonary effort is normal.     Breath sounds: Normal breath sounds.  Lymphadenopathy:     Cervical: No cervical adenopathy.  Skin:    General: Skin is warm and dry.  Neurological:     Mental Status: She is alert.      Assessment/Plan: Please see individual problem list.  Problem List Items Addressed This Visit     Sinusitis    Symptoms are consistent with sinusitis.  Given duration of symptoms we will proceed with antibiotic treatment.  We will start on Augmentin 1 tablet twice daily for 7 days.  If not  improving she will let us know.  Discussed risk of diarrhea with this.  If she has excessive diarrhea she will let us know.  Advised if she develops yeast infection symptoms she contact us for Diflucan. Tessalon sent in for cough.       Relevant Medications   amoxicillin-clavulanate (AUGMENTIN) 875-125 MG tablet   benzonatate (TESSALON) 200 MG capsule   Other Relevant Orders   POC COVID-19     Return if symptoms worsen or fail to improve.   Tommi Rumps, MD [place]

## 2022-07-26 ENCOUNTER — Encounter: Payer: Self-pay | Admitting: Family Medicine

## 2022-07-27 NOTE — Telephone Encounter (Signed)
Patient needs to be reevaluated given her symptoms. She can see any available provider or go to [facility] walk in.

## 2022-07-27 NOTE — Telephone Encounter (Signed)
Noted  

## 2022-10-23 DIAGNOSIS — E0590 Thyrotoxicosis, unspecified without thyrotoxic crisis or storm: Secondary | ICD-10-CM | POA: Diagnosis not present

## 2023-01-21 DIAGNOSIS — E0590 Thyrotoxicosis, unspecified without thyrotoxic crisis or storm: Secondary | ICD-10-CM | POA: Diagnosis not present

## 2023-02-04 DIAGNOSIS — E0590 Thyrotoxicosis, unspecified without thyrotoxic crisis or storm: Secondary | ICD-10-CM | POA: Diagnosis not present

## 2023-03-22 LAB — OB RESULTS CONSOLE GC/CHLAMYDIA
Chlamydia: NEGATIVE
Neisseria Gonorrhea: NEGATIVE

## 2023-04-05 LAB — OB RESULTS CONSOLE HEPATITIS B SURFACE ANTIGEN: Hepatitis B Surface Ag: NEGATIVE

## 2023-04-05 LAB — HEPATITIS C ANTIBODY: HCV Ab: NEGATIVE

## 2023-04-05 LAB — OB RESULTS CONSOLE RUBELLA ANTIBODY, IGM: Rubella: IMMUNE

## 2023-04-05 LAB — OB RESULTS CONSOLE HIV ANTIBODY (ROUTINE TESTING): HIV: NONREACTIVE

## 2023-04-12 ENCOUNTER — Inpatient Hospital Stay (HOSPITAL_COMMUNITY): Payer: 59

## 2023-04-12 ENCOUNTER — Inpatient Hospital Stay (HOSPITAL_COMMUNITY)
Admission: AD | Admit: 2023-04-12 | Discharge: 2023-04-12 | Disposition: A | Discharge status: Home / Self Care | Payer: 59 | Attending: Obstetrics | Admitting: Obstetrics

## 2023-04-12 ENCOUNTER — Encounter (HOSPITAL_COMMUNITY): Payer: Self-pay | Admitting: Obstetrics

## 2023-04-12 DIAGNOSIS — O26891 Other specified pregnancy related conditions, first trimester: Secondary | ICD-10-CM | POA: Diagnosis not present

## 2023-04-12 DIAGNOSIS — O468X1 Other antepartum hemorrhage, first trimester: Secondary | ICD-10-CM

## 2023-04-12 DIAGNOSIS — Z87891 Personal history of nicotine dependence: Secondary | ICD-10-CM | POA: Diagnosis not present

## 2023-04-12 DIAGNOSIS — R1030 Lower abdominal pain, unspecified: Secondary | ICD-10-CM | POA: Diagnosis not present

## 2023-04-12 DIAGNOSIS — O418X10 Other specified disorders of amniotic fluid and membranes, first trimester, not applicable or unspecified: Secondary | ICD-10-CM | POA: Diagnosis not present

## 2023-04-12 DIAGNOSIS — O208 Other hemorrhage in early pregnancy: Secondary | ICD-10-CM | POA: Insufficient documentation

## 2023-04-12 DIAGNOSIS — Z3491 Encounter for supervision of normal pregnancy, unspecified, first trimester: Secondary | ICD-10-CM

## 2023-04-12 DIAGNOSIS — Z3A11 11 weeks gestation of pregnancy: Secondary | ICD-10-CM

## 2023-04-12 LAB — URINALYSIS, ROUTINE W REFLEX MICROSCOPIC
Bacteria, UA: NONE SEEN
Bilirubin Urine: NEGATIVE
Glucose, UA: NEGATIVE mg/dL
Ketones, ur: NEGATIVE mg/dL
Leukocytes,Ua: NEGATIVE
Nitrite: NEGATIVE
Protein, ur: NEGATIVE mg/dL
Specific Gravity, Urine: 1.027 (ref 1.005–1.030)
pH: 5 (ref 5.0–8.0)

## 2023-04-12 NOTE — [facility] Note (Signed)
.  Lynn Henry is a 27 y.o. at [redacted]w[redacted]d here in [facility] reporting: started having some lower abd pain and cramping felt a gush and went to [facility] and was having moderate amount of vag bleeding.  LMP:  Onset of complaint: 1200 Pain score: 4 Vitals:   04/12/23 1336  BP: 116/78  Pulse: (!) 118  Resp: 18  Temp: 98.1 F (36.7 C)     FHT:176 Lab orders placed from triage:  u/a

## 2023-04-12 NOTE — MAU Provider Note (Signed)
Chief Complaint:  Vaginal Bleeding  HPI  Lynn Henry is a 27 y.o. G3P2002 at [redacted]w[redacted]d who presents to maternity admissions reporting a sharp lower abdominal cramp followed by a gush of blood. Started out as pink but then turned brighter red. Now slower and is turning brown. Has been wearing a pad for the past two hours without needing to change it, but did soak a pad on the way to MAU. No other physical complaints, was not doing anything strenuous when she felt the initial pain.   Pregnancy Course: Receives care at [facility]  Past Medical History:  Diagnosis Date   Chronic abdominal pain    presumed IBS   GERD (gastroesophageal reflux disease)    Hypertension    gestational HTN towards end of pregnancy   Hyperthyroidism    IBS (irritable bowel syndrome)    Spontaneous vaginal delivery 02/17/2018   OB History  Gravida Para Term Preterm AB Living  3 2 2     2   SAB IAB Ectopic Multiple Live Births        0 2    # Outcome Date GA Lbr Len/2nd Weight Sex Type Anes PTL Lv  3 Current           2 Term 05/25/20 [redacted]w[redacted]d 02:53 / 00:15 8 lb 10.8 oz (3.935 kg) M Vag-Spont EPI  LIV  1 Term 02/17/18 [redacted]w[redacted]d 22:52 / 01:12 8 lb 14.5 oz (4.04 kg) M Vag-Spont EPI  LIV   Past Surgical History:  Procedure Laterality Date   TOOTH EXTRACTION     Family History  Problem Relation Age of Onset   Hyperlipidemia Mother    Hypertension Mother    Prostate cancer Maternal Grandfather    Hyperlipidemia Maternal Grandfather    Heart disease Maternal Grandfather    Stroke Maternal Grandfather    Hypertension Maternal Grandfather    Arthritis Paternal Grandmother    Prostate cancer Paternal Grandfather    Diabetes Paternal Grandfather    Social History   Tobacco Use   Smoking status: Former    Current packs/day: 0.00    Types: Cigarettes    Quit date: 06/19/2017    Years since quitting: 5.8   Smokeless tobacco: Never   Tobacco comments:    quit when pregnanct  Substance Use Topics    Alcohol use: No    Alcohol/week: 0.0 standard drinks of alcohol   Drug use: No   Allergies  Allergen Reactions   Almond (Diagnostic) Nausea Only   No medications prior to admission.   I have reviewed patient's Past Medical Hx, Surgical Hx, Family Hx, Social Hx, medications and allergies.   ROS  Pertinent items noted in HPI and remainder of comprehensive ROS otherwise negative.   PHYSICAL EXAM  Patient Vitals for the past 24 hrs:  BP Temp Pulse Resp Height Weight  04/12/23 1336 116/78 98.1 F (36.7 C) (!) 118 18 5\' 5"  (1.651 m) 143 lb (64.9 kg)   Constitutional: Well-developed, well-nourished female in no acute distress.  Cardiovascular: normal rate & rhythm, warm and well-perfused Respiratory: normal effort, no problems with respiration noted GI: Abd soft, non-tender, non-distended MS: Extremities nontender, no edema, normal ROM Neurologic: Alert and oriented x 4.  GU: no CVA tenderness Pelvic: exam deferred, sent to U/S  FHR: 176  Labs: Results for orders placed or performed during the hospital encounter of 04/12/23 (from the past 24 hour(s))  Urinalysis, Routine w reflex microscopic -Urine, Clean Catch     Status: Abnormal  Collection Time: 04/12/23  1:44 PM  Result Value Ref Range   Color, Urine YELLOW YELLOW   APPearance CLEAR CLEAR   Specific Gravity, Urine 1.027 1.005 - 1.030   pH 5.0 5.0 - 8.0   Glucose, UA NEGATIVE NEGATIVE mg/dL   Hgb urine dipstick MODERATE (A) NEGATIVE   Bilirubin Urine NEGATIVE NEGATIVE   Ketones, ur NEGATIVE NEGATIVE mg/dL   Protein, ur NEGATIVE NEGATIVE mg/dL   Nitrite NEGATIVE NEGATIVE   Leukocytes,Ua NEGATIVE NEGATIVE   RBC / HPF 0-5 0 - 5 RBC/hpf   WBC, UA 0-5 0 - 5 WBC/hpf   Bacteria, UA NONE SEEN NONE SEEN   Squamous Epithelial / HPF 0-5 0 - 5 /HPF   Mucus PRESENT    Imaging:  US OB Comp Less 14 Wks  Result Date: 04/12/2023 CLINICAL DATA:  Vaginal bleeding during early pregnancy. Quantitative beta HCG is pending. EXAM:  OBSTETRIC <14 WK [place] AND TRANSVAGINAL OB US TECHNIQUE: Both transabdominal and transvaginal ultrasound examinations were performed for complete evaluation of the gestation as well as the maternal uterus, adnexal regions, and pelvic cul-de-sac. Transvaginal technique was performed to assess early pregnancy. COMPARISON:  CT 03/16/2015 FINDINGS: Intrauterine gestational sac: A single intrauterine gestational sac is present. Yolk sac:  Yolk sac is present. Embryo:  Fetal pole is present. Cardiac Activity: Fetal cardiac activity is identified. Heart Rate: 176 bpm CRL:  37.3 mm   10 w   4 d                  [place] EDC: 11/04/2023 Subchorionic hemorrhage: A small subchorionic hemorrhage is identified posteriorly. Maternal uterus/adnexae: Uterus is retroverted. No myometrial mass lesions are seen. Both ovaries are visualized and appear normal. No abnormal adnexal masses. No abnormal pelvic fluid. IMPRESSION: A single intrauterine pregnancy is demonstrated. Estimated gestational age by crown-rump length is 10 weeks 4 days. Small subchorionic hemorrhage. Electronically Signed   By: Burman Nieves M.D.   On: 04/12/2023 17:15    MDM & MAU COURSE  MDM: Moderate  MAU Course: Orders Placed This Encounter  Procedures   US OB Comp Less 14 Wks   Urinalysis, Routine w reflex microscopic -Urine, Clean Catch   Discharge patient   No orders of the defined types were placed in this encounter.  U/S ordered from triage due to MAU acuity and patient stability/easing of bleeding.  Results reviewed and reassurance given. Explained [facility] and importance of pelvic rest for the next 2 weeks. Can follow up with regular OB as scheduled.   ASSESSMENT   1. Subchorionic hematoma in first trimester, single or unspecified fetus   2. [redacted] weeks gestation of pregnancy   3. Presence of fetal heart sounds in first trimester    PLAN  Discharge home in stable condition with bleeding precautions  - education on [facility] in AVS   Follow-up  Information     Ob/Gyn, [facility] Follow up.   Why: as scheduled for ongoing prenatal care Contact information: [address] [place] [address] [phone]                 Allergies as of 04/12/2023       Reactions   Almond (diagnostic) Nausea Only        Medication List     TAKE these medications    amoxicillin-clavulanate 875-125 MG tablet Commonly known as: AUGMENTIN Take 1 tablet by mouth 2 (two) times daily.   benzonatate 200 MG capsule Commonly known as: TESSALON  Take 1 capsule (200 mg total) by mouth 2 (two) times daily as needed for cough.   cetirizine 10 MG tablet Commonly known as: ZYRTEC Take 10 mg by mouth daily.   levocetirizine 5 MG tablet Commonly known as: XYZAL levocetirizine 5 mg tablet  TAKE 1 TABLET BY MOUTH EVERY DAY IN THE EVENING FOR 30 DAYS   methimazole 10 MG tablet Commonly known as: TAPAZOLE methimazole 10 mg tablet  TAKE 1 TABLET BY MOUTH TWICE A DAY   norethindrone 0.35 MG tablet Commonly known as: MICRONOR Take 1 tablet by mouth daily.   predniSONE 10 MG (21) Tbpk tablet Commonly known as: STERAPRED UNI-PAK 21 TAB PO: Take 6 tablets on day 1:Take 5 tablets day 2:Take 4 tablets day 3: Take 3 tablets day 4:Take 2 tablets day five: Take 1 tablet day 6       Edd Arbour, CNM, MSN, [employer] Certified Nurse Midwife, [facility]

## 2023-09-04 ENCOUNTER — Ambulatory Visit: Payer: 59 | Admitting: Cardiology

## 2023-09-25 NOTE — L&D Delivery Note (Signed)
Delivery Note She progressed quickly to complete. After 2 pushes, at 7:05 PM a viable female "Lynn Henry"was delivered via Vaginal, Spontaneous (Presentation:   Occiput Anterior).  Compound left hand was noted. Anterior and posterior shoulders delivered within same contraction and body easily followed. A body cord was noted. He was placed on his mother's abdomen. Cord was cut after it stopped pulsing per patient request after discussion of risk and benefits. APGAR: 7, 9; weight pending .   Placenta status: Spontaneous, Intact.  Cord: 3 vessels with the following complications: None.  Cord pH: N/A  Anesthesia: Epidural Episiotomy: None Lacerations: 1st degree Suture Repair: 3.0 vicryl Est. Blood Loss (mL): 52  Mom to postpartum.  Baby to Couplet care / Skin to Skin.  Junius Creamer 10/11/2023, 7:26 PM

## 2023-10-11 ENCOUNTER — Other Ambulatory Visit: Payer: Self-pay

## 2023-10-11 ENCOUNTER — Inpatient Hospital Stay (HOSPITAL_COMMUNITY): Payer: 59 | Admitting: Anesthesiology

## 2023-10-11 ENCOUNTER — Inpatient Hospital Stay (HOSPITAL_COMMUNITY)
Admission: AD | Admit: 2023-10-11 | Discharge: 2023-10-13 | DRG: 807 | Disposition: A | Discharge status: Home / Self Care | Payer: 59 | Attending: Student | Admitting: Student

## 2023-10-11 ENCOUNTER — Encounter (HOSPITAL_COMMUNITY): Payer: Self-pay | Admitting: Student

## 2023-10-11 DIAGNOSIS — E0500 Thyrotoxicosis with diffuse goiter without thyrotoxic crisis or storm: Secondary | ICD-10-CM | POA: Diagnosis present

## 2023-10-11 DIAGNOSIS — O26893 Other specified pregnancy related conditions, third trimester: Secondary | ICD-10-CM | POA: Diagnosis present

## 2023-10-11 DIAGNOSIS — Z8249 Family history of ischemic heart disease and other diseases of the circulatory system: Secondary | ICD-10-CM

## 2023-10-11 DIAGNOSIS — K219 Gastro-esophageal reflux disease without esophagitis: Secondary | ICD-10-CM | POA: Diagnosis present

## 2023-10-11 DIAGNOSIS — O326XX0 Maternal care for compound presentation, not applicable or unspecified: Secondary | ICD-10-CM | POA: Diagnosis present

## 2023-10-11 DIAGNOSIS — O4292 Full-term premature rupture of membranes, unspecified as to length of time between rupture and onset of labor: Principal | ICD-10-CM | POA: Diagnosis present

## 2023-10-11 DIAGNOSIS — O99284 Endocrine, nutritional and metabolic diseases complicating childbirth: Secondary | ICD-10-CM | POA: Diagnosis present

## 2023-10-11 DIAGNOSIS — Z3A37 37 weeks gestation of pregnancy: Secondary | ICD-10-CM | POA: Diagnosis not present

## 2023-10-11 DIAGNOSIS — O9962 Diseases of the digestive system complicating childbirth: Secondary | ICD-10-CM | POA: Diagnosis present

## 2023-10-11 DIAGNOSIS — Z87891 Personal history of nicotine dependence: Secondary | ICD-10-CM | POA: Diagnosis not present

## 2023-10-11 DIAGNOSIS — Z833 Family history of diabetes mellitus: Secondary | ICD-10-CM | POA: Diagnosis not present

## 2023-10-11 LAB — TYPE AND SCREEN
ABO/RH(D): O POS
Antibody Screen: NEGATIVE

## 2023-10-11 LAB — GROUP B STREP BY PCR: Group B strep by PCR: NEGATIVE

## 2023-10-11 LAB — RPR: RPR Ser Ql: NONREACTIVE

## 2023-10-11 LAB — CBC
HCT: 33.5 % — ABNORMAL LOW (ref 36.0–46.0)
Hemoglobin: 11.2 g/dL — ABNORMAL LOW (ref 12.0–15.0)
MCH: 29.2 pg (ref 26.0–34.0)
MCHC: 33.4 g/dL (ref 30.0–36.0)
MCV: 87.5 fL (ref 80.0–100.0)
Platelets: 187 10*3/uL (ref 150–400)
RBC: 3.83 MIL/uL — ABNORMAL LOW (ref 3.87–5.11)
RDW: 13.7 % (ref 11.5–15.5)
WBC: 7.3 10*3/uL (ref 4.0–10.5)
nRBC: 0 % (ref 0.0–0.2)

## 2023-10-11 LAB — POCT FERN TEST: POCT Fern Test: POSITIVE

## 2023-10-11 MED ORDER — FENTANYL-BUPIVACAINE-NACL 0.5-0.125-0.9 MG/250ML-% EP SOLN
12.0000 mL/h | EPIDURAL | Status: DC | PRN
  Filled 2023-10-11: qty 250

## 2023-10-11 MED ORDER — LACTATED RINGERS IV SOLN: INTRAVENOUS | Status: DC

## 2023-10-11 MED ORDER — TRANEXAMIC ACID-NACL 1000-0.7 MG/100ML-% IV SOLN
1000.0000 mg | INTRAVENOUS | Status: AC
Start: 2023-10-11 — End: 2023-10-11
  Administered 2023-10-11: 1000 mg via INTRAVENOUS

## 2023-10-11 MED ORDER — PHENYLEPHRINE 80 MCG/ML (10ML) SYRINGE FOR IV PUSH (FOR BLOOD PRESSURE SUPPORT)
80.0000 ug | PREFILLED_SYRINGE | INTRAVENOUS | Status: DC | PRN
  Filled 2023-10-11: qty 10

## 2023-10-11 MED ORDER — EPHEDRINE 5 MG/ML INJ: 10.0000 mg | INTRAVENOUS | Status: DC | PRN

## 2023-10-11 MED ORDER — TRANEXAMIC ACID-NACL 1000-0.7 MG/100ML-% IV SOLN
INTRAVENOUS | Status: AC
  Filled 2023-10-11: qty 100

## 2023-10-11 MED ORDER — PHENYLEPHRINE 80 MCG/ML (10ML) SYRINGE FOR IV PUSH (FOR BLOOD PRESSURE SUPPORT): 80.0000 ug | PREFILLED_SYRINGE | INTRAVENOUS | Status: DC | PRN

## 2023-10-11 MED ORDER — EPHEDRINE 5 MG/ML INJ
10.0000 mg | INTRAVENOUS | Status: DC | PRN
Start: 2023-10-11 — End: 2023-10-11

## 2023-10-11 MED ORDER — DIPHENHYDRAMINE HCL 50 MG/ML IJ SOLN: 12.5000 mg | INTRAMUSCULAR | Status: DC | PRN

## 2023-10-11 MED ORDER — LACTATED RINGERS IV SOLN: 500.0000 mL | Freq: Once | INTRAVENOUS | Status: DC

## 2023-10-11 MED ORDER — WITCH HAZEL-GLYCERIN EX PADS: 1.0000 | MEDICATED_PAD | CUTANEOUS | Status: DC | PRN

## 2023-10-11 MED ORDER — OXYCODONE-ACETAMINOPHEN 5-325 MG PO TABS: 2.0000 | ORAL_TABLET | ORAL | Status: DC | PRN

## 2023-10-11 MED ORDER — PRENATAL MULTIVITAMIN CH
1.0000 | ORAL_TABLET | Freq: Every day | ORAL | Status: DC
  Administered 2023-10-12 – 2023-10-13 (×2): 1 via ORAL
  Filled 2023-10-11 (×2): qty 1

## 2023-10-11 MED ORDER — OXYTOCIN-SODIUM CHLORIDE 30-0.9 UT/500ML-% IV SOLN
1.0000 m[IU]/min | INTRAVENOUS | Status: DC
Start: 2023-10-11 — End: 2023-10-11
  Administered 2023-10-11: 2 m[IU]/min via INTRAVENOUS

## 2023-10-11 MED ORDER — SOD CITRATE-CITRIC ACID 500-334 MG/5ML PO SOLN: 30.0000 mL | ORAL | Status: DC | PRN

## 2023-10-11 MED ORDER — SENNOSIDES-DOCUSATE SODIUM 8.6-50 MG PO TABS
2.0000 | ORAL_TABLET | Freq: Every day | ORAL | Status: DC
  Administered 2023-10-12 – 2023-10-13 (×2): 2 via ORAL
  Filled 2023-10-11 (×2): qty 2

## 2023-10-11 MED ORDER — OXYTOCIN BOLUS FROM INFUSION
333.0000 mL | Freq: Once | INTRAVENOUS | Status: AC
  Administered 2023-10-11: 333 mL via INTRAVENOUS

## 2023-10-11 MED ORDER — COCONUT OIL OIL: 1.0000 | TOPICAL_OIL | Status: DC | PRN

## 2023-10-11 MED ORDER — LACTATED RINGERS IV SOLN: 500.0000 mL | INTRAVENOUS | Status: DC | PRN

## 2023-10-11 MED ORDER — FENTANYL-BUPIVACAINE-NACL 0.5-0.125-0.9 MG/250ML-% EP SOLN
EPIDURAL | Status: DC | PRN
  Administered 2023-10-11: 12 mL/h via EPIDURAL

## 2023-10-11 MED ORDER — OXYCODONE-ACETAMINOPHEN 5-325 MG PO TABS: 1.0000 | ORAL_TABLET | ORAL | Status: DC | PRN

## 2023-10-11 MED ORDER — TERBUTALINE SULFATE 1 MG/ML IJ SOLN: 0.2500 mg | Freq: Once | INTRAMUSCULAR | Status: DC | PRN

## 2023-10-11 MED ORDER — BENZOCAINE-MENTHOL 20-0.5 % EX AERO
1.0000 | INHALATION_SPRAY | CUTANEOUS | Status: DC | PRN
  Administered 2023-10-12: 1 via TOPICAL
  Filled 2023-10-11: qty 56

## 2023-10-11 MED ORDER — ONDANSETRON HCL 4 MG/2ML IJ SOLN: 4.0000 mg | INTRAMUSCULAR | Status: DC | PRN

## 2023-10-11 MED ORDER — METHIMAZOLE 2.5 MG HALF TABLET
2.5000 mg | ORAL_TABLET | Freq: Every day | ORAL | Status: DC
  Administered 2023-10-11 – 2023-10-12 (×2): 2.5 mg via ORAL
  Filled 2023-10-11 (×3): qty 1

## 2023-10-11 MED ORDER — OXYTOCIN-SODIUM CHLORIDE 30-0.9 UT/500ML-% IV SOLN
2.5000 [IU]/h | INTRAVENOUS | Status: DC
  Filled 2023-10-11: qty 500

## 2023-10-11 MED ORDER — LIDOCAINE HCL (PF) 1 % IJ SOLN: 30.0000 mL | INTRAMUSCULAR | Status: DC | PRN

## 2023-10-11 MED ORDER — ACETAMINOPHEN 325 MG PO TABS: 650.0000 mg | ORAL_TABLET | ORAL | Status: DC | PRN

## 2023-10-11 MED ORDER — ZOLPIDEM TARTRATE 5 MG PO TABS: 5.0000 mg | ORAL_TABLET | Freq: Every evening | ORAL | Status: DC | PRN

## 2023-10-11 MED ORDER — DIBUCAINE (PERIANAL) 1 % EX OINT: 1.0000 | TOPICAL_OINTMENT | CUTANEOUS | Status: DC | PRN

## 2023-10-11 MED ORDER — IBUPROFEN 600 MG PO TABS
600.0000 mg | ORAL_TABLET | Freq: Four times a day (QID) | ORAL | Status: DC
  Administered 2023-10-11 – 2023-10-13 (×7): 600 mg via ORAL
  Filled 2023-10-11 (×7): qty 1

## 2023-10-11 MED ORDER — ONDANSETRON HCL 4 MG PO TABS: 4.0000 mg | ORAL_TABLET | ORAL | Status: DC | PRN

## 2023-10-11 MED ORDER — SIMETHICONE 80 MG PO CHEW: 80.0000 mg | CHEWABLE_TABLET | ORAL | Status: DC | PRN

## 2023-10-11 MED ORDER — TETANUS-DIPHTH-ACELL PERTUSSIS 5-2.5-18.5 LF-MCG/0.5 IM SUSY: 0.5000 mL | PREFILLED_SYRINGE | Freq: Once | INTRAMUSCULAR | Status: DC

## 2023-10-11 MED ORDER — ONDANSETRON HCL 4 MG/2ML IJ SOLN: 4.0000 mg | Freq: Four times a day (QID) | INTRAMUSCULAR | Status: DC | PRN

## 2023-10-11 MED ORDER — LIDOCAINE HCL (PF) 1 % IJ SOLN
INTRAMUSCULAR | Status: DC | PRN
  Administered 2023-10-11: 5 mL via EPIDURAL

## 2023-10-11 MED ORDER — ACETAMINOPHEN 325 MG PO TABS
650.0000 mg | ORAL_TABLET | ORAL | Status: DC | PRN
  Administered 2023-10-12 – 2023-10-13 (×4): 650 mg via ORAL
  Filled 2023-10-11 (×4): qty 2

## 2023-10-11 MED ORDER — DIPHENHYDRAMINE HCL 25 MG PO CAPS: 25.0000 mg | ORAL_CAPSULE | Freq: Four times a day (QID) | ORAL | Status: DC | PRN

## 2023-10-11 NOTE — Progress Notes (Signed)
Assumed care

## 2023-10-11 NOTE — Progress Notes (Signed)
Lynn Henry is a 28 y.o. G3P2002 at [redacted]w[redacted]d   Objective: BP (!) 104/57   Pulse 100   Temp 99.3 F (37.4 C) (Oral)   Resp 15   Ht 5\' 5"  (1.651 m)   Wt 83.9 kg   LMP 06/22/2022 (Exact Date)   SpO2 100%   BMI 30.79 kg/m  No intake/output data recorded. No intake/output data recorded.  FHT:  FHR: 120 bpm, variability: moderate,  accelerations:  Present,  decelerations:  Present variable UC:   regular, every 2 minutes SVE:   Dilation: 9 Effacement (%): 90 Station: Plus 1 Exam by:: [employer]  Labs: Lab Results  Component Value Date   WBC 7.3 10/11/2023   HGB 11.2 (L) 10/11/2023   HCT 33.5 (L) 10/11/2023   MCV 87.5 10/11/2023   PLT 187 10/11/2023    Assessment / Plan: 28 yo G3P2002 @ 37.0 admitted for PROM - Anticipate SVD   Junius Creamer, DO 10/11/2023, 6:59 PM

## 2023-10-11 NOTE — H&P (Signed)
Lynn Henry is a 28 y.o. female G3P2002 @ 37.0 (EDD: 11/01/2023 by LMP cw 7 week [place]) who presented for leakage of clear fluid at 0600. ROM was confirmed in MAU with positive fern.   +Fms. Denies VB. Contractions are present but mild at this time.   Pregnancy is complicated by:  - History of PreE w/ SF: onset 1 wk PP with G2. Taking ASA - Graves disease: Diagnosed PP after G2. Follows with [facility]. Taking Methimazole 2.5 mg which was started at the end of the 1st trimester. TSH 11/13 1.1. - Tachycardia: Previously on Propranolol, none currently - Suspected fetal macrosomia: [place] 1/9, 3086 (84%), AC 92% - Borderline HgbA2 may represent beta thalassemia minor-horizon panel negative   OB History     Gravida  3   Para  2   Term  2   Preterm      AB      Living  2      SAB      IAB      Ectopic      Multiple  0   Live Births  2          Past Medical History:  Diagnosis Date   Chronic abdominal pain    presumed IBS   GERD (gastroesophageal reflux disease)    Hypertension    gestational HTN towards end of pregnancy   Hyperthyroidism    IBS (irritable bowel syndrome)    Spontaneous vaginal delivery 02/17/2018   Past Surgical History:  Procedure Laterality Date   TOOTH EXTRACTION     Family History: family history includes Arthritis in her paternal grandmother; Diabetes in her paternal grandfather; Heart disease in her maternal grandfather; Hyperlipidemia in her maternal grandfather and mother; Hypertension in her maternal grandfather and mother; Prostate cancer in her maternal grandfather and paternal grandfather; Stroke in her maternal grandfather. Social History:  reports that she quit smoking about 6 years ago. Her smoking use included cigarettes. She has never used smokeless tobacco. She reports that she does not drink alcohol and does not use drugs.     Maternal Diabetes: No Genetic Screening: Normal Maternal Ultrasounds/Referrals: Other:  suspected macrosomia Fetal Ultrasounds or other Referrals:  None Maternal Substance Abuse:  No Significant Maternal Medications:  Meds include: Other: Methimazole Significant Maternal Lab Results:  Other: GBS unknown Number of Prenatal Visits:greater than 3 verified prenatal visits Maternal Vaccinations: Declined flu/tdap/RSV Other Comments:  None  Review of Systems  Constitutional:  Negative for chills and fever.  Respiratory:  Negative for chest tightness and shortness of breath.   Cardiovascular:  Negative for chest pain.  Gastrointestinal:  Positive for abdominal pain. Constipation: .Marland Kitchen Genitourinary:  Positive for pelvic pain and vaginal discharge. Negative for vaginal bleeding.  Neurological:  Negative for light-headedness and headaches.   History Dilation: 2 Effacement (%): 60 Station: -3 Exam by:: Dr Katrinka Blazing Blood pressure 124/89, pulse (!) 105, temperature 98.6 F (37 C), temperature source Oral, resp. rate 18, last menstrual period 06/22/2022, SpO2 100%, unknown if currently breastfeeding. Exam Physical Exam Constitutional:      General: She is not in acute distress.    Appearance: Normal appearance.  HENT:     Head: Normocephalic and atraumatic.  Pulmonary:     Effort: Pulmonary effort is normal.  Abdominal:     Palpations: Abdomen is soft.  Musculoskeletal:        General: Normal range of motion.  Skin:    General: Skin is warm and dry.  Neurological:     Mental Status: She is alert.  Psychiatric:        Mood and Affect: Mood normal.        Behavior: Behavior normal.     Prenatal labs: ABO, Rh:  O+ Antibody:  neg Rubella:  Imm RPR:   neg HBsAg:   neg HIV:   neg GBS:   unknown  Assessment/Plan: 28 yo G3P2002 @ 37.0  - Admit for PROM  - CTX regular, will recheck in a few hours. If no cervical change will proceed with pitocin  - GBS unknown: previously neg in prior pregnancies. Rapid GBS ordered - Continue methimazole - Epidural when requested by  pt   Junius Creamer 10/11/2023, 10:05 AM

## 2023-10-11 NOTE — Anesthesia Procedure Notes (Signed)
Epidural Patient location during procedure: OB Start time: 10/11/2023 4:49 PM End time: 10/11/2023 5:01 PM  Staffing Anesthesiologist: Trevor Iha, MD Performed: anesthesiologist   Preanesthetic Checklist Completed: patient identified, IV checked, site marked, risks and benefits discussed, surgical consent, monitors and equipment checked, pre-op evaluation and timeout performed  Epidural Patient position: sitting Prep: DuraPrep and site prepped and draped Patient monitoring: continuous pulse ox and blood pressure Approach: midline Location: L3-L4 Injection technique: LOR air  Needle:  Needle type: Tuohy  Needle gauge: 17 G Needle length: 9 cm and 9 Needle insertion depth: 6 cm Catheter type: closed end flexible Catheter size: 19 Gauge Catheter at skin depth: 12 cm Test dose: negative  Assessment Events: blood not aspirated, no cerebrospinal fluid, injection not painful, no injection resistance, no paresthesia and negative IV test  Additional Notes Patient identified. Risks/Benefits/Options discussed with patient including but not limited to bleeding, infection, nerve damage, paralysis, failed block, incomplete pain control, headache, blood pressure changes, nausea, vomiting, reactions to medication both or allergic, itching and postpartum back pain. Confirmed with bedside nurse the patient's most recent platelet count. Confirmed with patient that they are not currently taking any anticoagulation, have any bleeding history or any family history of bleeding disorders. Patient expressed understanding and wished to proceed. All questions were answered. Sterile technique was used throughout the entire procedure. Please see nursing notes for vital signs. Test dose was given through epidural needle and negative prior to continuing to dose epidural or start infusion. Warning signs of high block given to the patient including shortness of breath, tingling/numbness in hands, complete  motor block, or any concerning symptoms with instructions to call for help. Patient was given instructions on fall risk and not to get out of bed. All questions and concerns addressed with instructions to call with any issues.  1 Attempt (S) . Patient tolerated procedure well.

## 2023-10-11 NOTE — MAU Note (Addendum)
.  Lynn Henry is a 28 y.o. at [redacted]w[redacted]d here in MAU reporting: SROM at 0600 this AM - reports gush of clear watery fluid, has continued to leak since. H/o preeclampsia with G1, suspected fetal macrosomia, and grave's disease.   Pain score: Denies pain  ROM: SROM at 0600, clear fluid Vaginal Bleeding: None at this time; small amount of bloody show when water first broke. Labor Pain Management Plan: Planning epidural  Fetal Movement: Reports positive FM FHT: Fetal Heart Rate Mode: External Baseline Rate (A): 145 bpm Multiple birth?: No  Vitals:   10/11/23 0829  BP: 130/88  Pulse: (!) 112  Resp: 18  Temp: 98.6 F (37 C)  SpO2: 100%     OB Office: [place] GBS:  Has not had it done yet ; will request order for rapid GBS Lab orders placed from triage: Fern swab

## 2023-10-11 NOTE — Anesthesia Preprocedure Evaluation (Addendum)
Anesthesia Evaluation   Patient awake    Reviewed: Allergy & Precautions, NPO status , Patient's Chart, lab work & pertinent test results  Airway Mallampati: II  TM Distance: >3 FB Neck ROM: Full    Dental no notable dental hx. (+) Teeth Intact, Dental Advisory Given   Pulmonary former smoker   Pulmonary exam normal breath sounds clear to auscultation       Cardiovascular hypertension, Normal cardiovascular exam Rhythm:Regular Rate:Normal     Neuro/Psych  Headaches  negative psych ROS   GI/Hepatic Neg liver ROS,GERD  ,,  Endo/Other   Hyperthyroidism   Renal/GU      Musculoskeletal   Abdominal   Peds  Hematology Lab Results      Component                Value               Date                      WBC                      7.3                 10/11/2023                HGB                      11.2 (L)            10/11/2023                HCT                      33.5 (L)            10/11/2023                MCV                      87.5                10/11/2023                PLT                      187                 10/11/2023              Anesthesia Other Findings   Reproductive/Obstetrics (+) Pregnancy                             Anesthesia Physical Anesthesia Plan  ASA: 2  Anesthesia Plan: Epidural   Post-op Pain Management:    Induction:   PONV Risk Score and Plan:   Airway Management Planned:   Additional Equipment:   Intra-op Plan:   Post-operative Plan:   Informed Consent: I have reviewed the patients History and Physical, chart, labs and discussed the procedure including the risks, benefits and alternatives for the proposed anesthesia with the patient or authorized representative who has indicated his/her understanding and acceptance.       Plan Discussed with:   Anesthesia Plan Comments: (37 wk G3p2 for LEA)       Anesthesia Quick Evaluation

## 2023-10-11 NOTE — Lactation Note (Signed)
This note was copied from a baby's chart. Lactation Consultation Note  Patient Name: Lynn Henry [id number] Date: 10/11/2023 Age:28 hours Reason for consult: Initial assessment;Early term 37-38.6wks;Maternal endocrine disorder  P3- MOB reports that infant has been actively nursing on and off since he was born. MOB denies any pain or pinching at this time. MOB had infant latched to the left breast in the cross cradle hold. Infant had a strong rhythmic suck and needed no extra stimulation to continue sucking. MOB denied any questions or concerns at this time.  LC reviewed feeding infant on cue 8-12x in 24 hrs, not allowing infant to go over 3 hrs without a feeding, CDC milk storage guidelines and LC services handout. LC encouraged MOB to callf or further assistance as needed.  Maternal Data Does the patient have breastfeeding experience prior to this delivery?: Yes How long did the patient breastfeed?: 78yr and 9 months with second child  Feeding Mother's Current Feeding Choice: Breast Milk  LATCH Score Latch: Grasps breast easily, tongue down, lips flanged, rhythmical sucking.  Audible Swallowing: Spontaneous and intermittent  Type of Nipple: Everted at rest and after stimulation  Comfort (Breast/Nipple): Soft / non-tender  Hold (Positioning): No assistance needed to correctly position infant at breast.  LATCH Score: 10   Lactation Tools Discussed/Used Pump Education: Milk Storage  Interventions Interventions: Breast feeding basics reviewed;Education;LC Services brochure  Discharge Discharge Education: Warning signs for feeding baby Pump: DEBP;Manual;Hands Free;Personal  Consult Status Consult Status: Follow-up Date: 10/12/23 Follow-up type: In-patient    Lynn Henry BS, IBCLC 10/11/2023, 11:04 PM

## 2023-10-12 LAB — CBC
HCT: 29.2 % — ABNORMAL LOW (ref 36.0–46.0)
Hemoglobin: 9.8 g/dL — ABNORMAL LOW (ref 12.0–15.0)
MCH: 29.3 pg (ref 26.0–34.0)
MCHC: 33.6 g/dL (ref 30.0–36.0)
MCV: 87.4 fL (ref 80.0–100.0)
Platelets: 182 10*3/uL (ref 150–400)
RBC: 3.34 MIL/uL — ABNORMAL LOW (ref 3.87–5.11)
RDW: 13.5 % (ref 11.5–15.5)
WBC: 10.9 10*3/uL — ABNORMAL HIGH (ref 4.0–10.5)
nRBC: 0 % (ref 0.0–0.2)

## 2023-10-12 MED ORDER — OXYCODONE HCL 5 MG PO TABS
5.0000 mg | ORAL_TABLET | Freq: Four times a day (QID) | ORAL | Status: DC | PRN
  Administered 2023-10-12 – 2023-10-13 (×2): 5 mg via ORAL
  Filled 2023-10-12 (×2): qty 1

## 2023-10-12 NOTE — Progress Notes (Addendum)
POSTPARTUM PROGRESS NOTE  Post Partum Day #1  Subjective:  Patient in shower while attempting to round. Per RN, no events, pt aware of DC home tomorrow as infant will need thyroid labs. Will attempt to round in afternoon  Objective: Blood pressure 115/82, pulse 86, temperature 98.6 F (37 C), temperature source Oral, resp. rate 17, height 5\' 5"  (1.651 m), weight 83.9 kg, last menstrual period 06/22/2022, SpO2 100%, unknown if currently breastfeeding.  Physical Exam: deferred  Recent Labs    10/11/23 0914 10/12/23 0426  HGB 11.2* 9.8*  HCT 33.5* 29.2*    Assessment/Plan:  ASSESSMENT: KESHAUN NEWNHAM is a 28 y.o. G3P3003 s/p SVD @ [redacted]w[redacted]d. PNC c/b Graves on methimazole.   Plan for discharge tomorrow, Breastfeeding, and Circumcision prior to discharge ADDENDUM: Out of shower. Tolerating PO without N/V, minimal lochia, only some cramping with latch. Desires circ tomorrow morning. Fundus firm 2cm below umbilicus, non-tender.   LOS: 1 day

## 2023-10-12 NOTE — Anesthesia Postprocedure Evaluation (Signed)
Anesthesia Post Note  Patient: Lynn Henry  Procedure(s) Performed: AN AD HOC LABOR EPIDURAL     Patient location during evaluation: Mother Baby Anesthesia Type: Epidural Level of consciousness: awake and alert Pain management: pain level controlled Vital Signs Assessment: post-procedure vital signs reviewed and stable Respiratory status: spontaneous breathing, nonlabored ventilation and respiratory function stable Cardiovascular status: stable Postop Assessment: no headache, no backache, epidural receding and able to ambulate Anesthetic complications: no   No notable events documented.  Last Vitals:  Vitals:   10/12/23 0300 10/12/23 0717  BP: 115/81 115/82  Pulse: 92 86  Resp: 18 17  Temp:    SpO2: 100% 100%    Last Pain:  Vitals:   10/12/23 0717  TempSrc: Oral  PainSc: 4    Pain Goal:                   Jaynie Hitch

## 2023-10-12 NOTE — Lactation Note (Signed)
This note was copied from a baby's chart. Lactation Consultation Note  Patient Name: Lynn Henry [id number] Date: 10/12/2023 Age:28 hours Reason for consult: Follow-up assessment;Early term 37-38.6wks;Maternal endocrine disorder  P3- MOB reports that infant is still nursing great and causing no pain or pinching. MOB also reports that as of today she is hearing infant gulping and she can feel a letdown. MOB reports her breasts are full, but not engorged. LC reviewed engorgement/breast care just in case. MOB denies having any questions or concerns at this time.  LC reviewed CDC milk storage guidelines and LC services handout. LC also encouraged MOB to call for further assistance as needed.  Maternal Data Has patient been taught Hand Expression?: No Does the patient have breastfeeding experience prior to this delivery?: Yes How long did the patient breastfeed?: 1 year and 9 months with second child  Feeding Mother's Current Feeding Choice: Breast Milk  LATCH Score Latch: Grasps breast easily, tongue down, lips flanged, rhythmical sucking.  Audible Swallowing: Spontaneous and intermittent  Type of Nipple: Everted at rest and after stimulation  Comfort (Breast/Nipple): Soft / non-tender  Hold (Positioning): No assistance needed to correctly position infant at breast.  LATCH Score: 10   Lactation Tools Discussed/Used Pump Education: Milk Storage  Interventions Interventions: Breast feeding basics reviewed;Education;LC Services brochure  Discharge Discharge Education: Engorgement and breast care;Warning signs for feeding baby Pump: DEBP;Manual;Hands Free;Personal  Consult Status Consult Status: Follow-up Date: 10/13/23 Follow-up type: In-patient    Dema Severin BS, IBCLC 10/12/2023, 6:36 PM

## 2023-10-13 MED ORDER — OXYCODONE HCL 5 MG PO TABS: 5.0000 mg | ORAL_TABLET | Freq: Four times a day (QID) | ORAL | 0 refills | Status: AC | PRN

## 2023-10-13 MED ORDER — IBUPROFEN 800 MG PO TABS: 800.0000 mg | ORAL_TABLET | Freq: Three times a day (TID) | ORAL | 1 refills | Status: AC | PRN

## 2023-10-13 NOTE — Progress Notes (Signed)
POSTPARTUM PROGRESS NOTE  Post Partum Day #2  Subjective:  No acute events overnight.  Pt denies problems with ambulating, voiding or po intake.  She denies nausea or vomiting.  Pain is well controlled.  Lochia Minimal. Baby boy s/p circ  Objective: Blood pressure 111/78, pulse 87, temperature 98.2 F (36.8 C), temperature source Oral, resp. rate 18, height 5\' 5"  (1.651 m), weight 83.9 kg, last menstrual period 06/22/2022, SpO2 95%, unknown if currently breastfeeding.  Physical Exam:  General: alert, cooperative and no distress Lochia:normal flow Chest: CTAB Heart: RRR no m/r/g Abdomen: +BS, soft, nontender Uterine Fundus: firm, 2-3cm below umbilicus GU: suture intact, healing well, no purulent drainage Extremities: neg edema, neg calf TTP BL, neg Homans BL  Recent Labs    10/11/23 0914 10/12/23 0426  HGB 11.2* 9.8*  HCT 33.5* 29.2*    Assessment/Plan:  ASSESSMENT: Lynn Henry is a 28 y.o. G3P3003 s/p SVD @ [redacted]w[redacted]d. PNC c/b Graves on methimazole .   Discharge home and Breastfeeding Baby boy s/p circ   LOS: 2 days

## 2023-10-13 NOTE — Lactation Note (Signed)
This note was copied from a baby's chart. Lactation Consultation Note  Patient Name: Lynn Henry [id number] Date: 10/13/2023 Age:28 hours  Reason for consult: Follow-up assessment;Early term 37-38.6wks;Infant weight loss  P3, [redacted]w[redacted]d, 7% weight loss  Mother has experience with breastfeeding successfully. She reports breastfeeding is going well. Baby cluster fed last night and he was supplemented with formula when baby did not seem satisfied. Mother reports she typically has been an "over producer" of breast milk and was able to manage her supply by hand expression/ pumping to remove some milk prior to infant latching. Eventually her babies were able to accommodate her supply.   Mom made aware of O/P services, breastfeeding support groups, and our phone # for post-discharge questions.     Interventions Interventions: Education  Discharge Discharge Education: Engorgement and breast care;Warning signs for feeding baby  Consult Status Consult Status: Complete Date: 10/13/23    Omar Person 10/13/2023, 11:19 AM

## 2023-10-13 NOTE — Discharge Summary (Signed)
Postpartum Discharge Summary  Date of Service updated     Patient Name: Lynn Henry DOB: 14-Mar-1996 MRN: [id number]  Date of admission: 10/11/2023 Delivery date:10/11/2023 Delivering provider: Truett Perna A Date of discharge: 10/13/2023  Admitting diagnosis: Indication for care in labor or delivery [O75.9] Intrauterine pregnancy: [redacted]w[redacted]d     Secondary diagnosis:  Principal Problem:   Indication for care in labor or delivery  Additional problems: Graves disease    Discharge diagnosis: Term Pregnancy Delivered                                              Post partum procedures: none Augmentation: Pitocin Complications: None  Hospital course: Onset of Labor With Vaginal Delivery      28 y.o. yo G3P3003 at [redacted]w[redacted]d was admitted in Latent Labor on 10/11/2023. Labor course was complicated by PROM  Membrane Rupture Time/Date: 6:00 AM,10/11/2023  Delivery Method:Vaginal, Spontaneous Episiotomy: None Lacerations:  1st degree Patient had a postpartum course complicated by  none.  She is ambulating, tolerating a regular diet, passing flatus, and urinating well. Patient is discharged home in stable condition on 10/13/23.  Newborn Data: Birth date:10/11/2023 Birth time:7:05 PM Gender:Female Living status:Living Apgars:7 ,9  Weight:3370 g  Immunizations administered: Immunization History  Administered Date(s) Administered   DTaP 06/19/1996, 08/17/1996, 10/19/1996, 10/25/1997, 04/11/2001   HIB (PRP-OMP) 06/19/1996, 08/17/1996, 10/19/1996, 07/08/1997   HPV Quadrivalent 01/28/2014   Hepatitis A 05/05/2007, 07/26/2008   Hepatitis B Feb 03, 1996, 05/18/1996, 12/31/1996   IPV 06/19/1996, 08/17/1996, 10/19/1996, 04/11/2001   MMR 05/05/1997, 04/11/2001   Meningococcal Conjugate 05/05/2007   Meningococcal polysaccharide vaccine (MPSV4) 01/28/2014   Td 05/05/2007   Tdap 05/05/2007, 11/26/2017   Varicella 05/05/1997, 01/28/2014    Physical exam  Vitals:   10/12/23 0300 10/12/23 0717  10/12/23 1210 10/13/23 0525  BP: 115/81 115/82 118/82 111/78  Pulse: 92 86 (!) 107 87  Resp: 18 17 18 18   Temp:    98.2 F (36.8 C)  TempSrc: Oral Oral  Oral  SpO2: 100% 100% 97% 95%  Weight:      Height:       General: alert, cooperative, and no distress Lochia: appropriate Uterine Fundus: firm Incision: N/A DVT Evaluation: No evidence of DVT seen on physical exam. Negative Homan's sign. No cords or calf tenderness. Labs: Lab Results  Component Value Date   WBC 10.9 (H) 10/12/2023   HGB 9.8 (L) 10/12/2023   HCT 29.2 (L) 10/12/2023   MCV 87.4 10/12/2023   PLT 182 10/12/2023      Latest Ref Rng & Units 05/24/2021   11:06 AM  CMP  Glucose 70 - 99 mg/dL 79   BUN 6 - 23 mg/dL 15   Creatinine 4.01 - 1.20 mg/dL 0.27   Sodium 253 - 664 mEq/L 139   Potassium 3.5 - 5.1 mEq/L 4.4   Chloride 96 - 112 mEq/L 106   CO2 19 - 32 mEq/L 27   Calcium 8.4 - 10.5 mg/dL 9.3   Total Protein 6.0 - 8.3 g/dL 7.2   Total Bilirubin 0.2 - 1.2 mg/dL 0.6   Alkaline Phos 39 - 117 U/L 73   AST 0 - 37 U/L 13   ALT 0 - 35 U/L 9    Edinburgh Score:    10/11/2023   10:50 PM  Edinburgh Postnatal Depression Scale Screening Tool  I have been  able to laugh and see the funny side of things. 0  I have looked forward with enjoyment to things. 0  I have blamed myself unnecessarily when things went wrong. 0  I have been anxious or worried for no good reason. 1  I have felt scared or panicky for no good reason. 1  Things have been getting on top of me. 0  I have been so unhappy that I have had difficulty sleeping. 0  I have felt sad or miserable. 0  I have been so unhappy that I have been crying. 0  The thought of harming myself has occurred to me. 0  Edinburgh Postnatal Depression Scale Total 2      After visit meds:  Allergies as of 10/13/2023       Reactions   Almond (diagnostic) Nausea Only        Medication List     STOP taking these medications    amoxicillin-clavulanate 875-125 MG  tablet Commonly known as: AUGMENTIN   benzonatate 200 MG capsule Commonly known as: TESSALON   cetirizine 10 MG tablet Commonly known as: ZYRTEC   levocetirizine 5 MG tablet Commonly known as: XYZAL   norethindrone 0.35 MG tablet Commonly known as: MICRONOR   predniSONE 10 MG (21) Tbpk tablet Commonly known as: STERAPRED UNI-PAK 21 TAB       TAKE these medications    ibuprofen 800 MG tablet Commonly known as: ADVIL Take 1 tablet (800 mg total) by mouth every 8 (eight) hours as needed.   methimazole 10 MG tablet Commonly known as: TAPAZOLE methimazole 10 mg tablet  TAKE 1 TABLET BY MOUTH TWICE A DAY   oxyCODONE 5 MG immediate release tablet Commonly known as: Oxy IR/ROXICODONE Take 1 tablet (5 mg total) by mouth every 6 (six) hours as needed for severe pain (pain score 7-10) or breakthrough pain.         Discharge home in stable condition Infant Feeding: Breast Infant Disposition:home with mother Discharge instruction: per After Visit Summary and Postpartum booklet. Activity: Advance as tolerated. Pelvic rest for 6 weeks.  Diet: routine diet Anticipated Birth Control: Unsure Postpartum Appointment:6 weeks Follow up Visit: [facility]   10/13/2023 Carlisle Cater, MD

## 2023-10-22 ENCOUNTER — Telehealth (HOSPITAL_COMMUNITY): Payer: Self-pay | Admitting: *Deleted

## 2023-10-22 NOTE — Telephone Encounter (Signed)
10/22/2023  Name: Lynn Henry MRN: [id number] DOB: June 01, 1996  Reason for Call:  Transition of Care Hospital Discharge Call  Contact Status: Patient Contact Status: Message  Language assistant needed:          Follow-Up Questions:    Inocente Salles Postnatal Depression Scale:  In the Past 7 Days:    PHQ2-9 Depression Scale:     Discharge Follow-up:    Post-discharge interventions: NA  Malachy Mood  10/22/2023 1922

## 2023-10-23 ENCOUNTER — Other Ambulatory Visit: Payer: Self-pay | Admitting: Obstetrics and Gynecology

## 2023-11-01 ENCOUNTER — Inpatient Hospital Stay (HOSPITAL_COMMUNITY): Admit: 2023-11-01 | Payer: 59

## 2023-11-08 ENCOUNTER — Encounter (HOSPITAL_COMMUNITY): Payer: 59

## 2023-11-08 ENCOUNTER — Encounter (HOSPITAL_COMMUNITY): Payer: Self-pay

## 2024-02-12 ENCOUNTER — Encounter
